# Patient Record
Sex: Female | Born: 1935 | Hispanic: No | Marital: Married
Health system: Southern US, Community
[De-identification: ages and names within clinical notes are randomized; demographics above are authoritative.]

---

## 2004-07-16 ENCOUNTER — Ambulatory Visit: Payer: Self-pay | Admitting: Internal Medicine

## 2005-04-25 ENCOUNTER — Other Ambulatory Visit: Payer: Self-pay

## 2005-04-25 ENCOUNTER — Emergency Department: Payer: Self-pay | Admitting: Emergency Medicine

## 2005-05-14 ENCOUNTER — Ambulatory Visit: Payer: Self-pay | Admitting: Gerontology

## 2005-07-29 ENCOUNTER — Ambulatory Visit: Payer: Self-pay | Admitting: Internal Medicine

## 2005-09-12 ENCOUNTER — Ambulatory Visit: Payer: Self-pay | Admitting: Internal Medicine

## 2006-11-27 ENCOUNTER — Ambulatory Visit: Payer: Self-pay | Admitting: Internal Medicine

## 2011-03-06 ENCOUNTER — Ambulatory Visit: Payer: Self-pay | Admitting: Ophthalmology

## 2011-03-06 DIAGNOSIS — I10 Essential (primary) hypertension: Secondary | ICD-10-CM

## 2011-03-18 ENCOUNTER — Ambulatory Visit: Payer: Self-pay | Admitting: Ophthalmology

## 2011-05-28 ENCOUNTER — Ambulatory Visit: Payer: Self-pay | Admitting: Internal Medicine

## 2011-06-03 ENCOUNTER — Ambulatory Visit: Payer: Self-pay | Admitting: Internal Medicine

## 2011-07-15 ENCOUNTER — Ambulatory Visit: Payer: Self-pay | Admitting: Unknown Physician Specialty

## 2011-07-17 LAB — PATHOLOGY REPORT

## 2012-05-10 ENCOUNTER — Emergency Department: Payer: Self-pay | Admitting: Emergency Medicine

## 2012-05-10 LAB — CBC
HCT: 41.2 % (ref 35.0–47.0)
HGB: 13.8 g/dL (ref 12.0–16.0)
MCHC: 33.5 g/dL (ref 32.0–36.0)
MCV: 89 fL (ref 80–100)
Platelet: 166 10*3/uL (ref 150–440)
RDW: 14 % (ref 11.5–14.5)

## 2012-05-10 LAB — COMPREHENSIVE METABOLIC PANEL
Alkaline Phosphatase: 103 U/L (ref 50–136)
Anion Gap: 6 — ABNORMAL LOW (ref 7–16)
Bilirubin,Total: 0.3 mg/dL (ref 0.2–1.0)
Calcium, Total: 7.9 mg/dL — ABNORMAL LOW (ref 8.5–10.1)
Chloride: 101 mmol/L (ref 98–107)
Co2: 29 mmol/L (ref 21–32)
Creatinine: 0.86 mg/dL (ref 0.60–1.30)
EGFR (African American): >60
EGFR (Non-African Amer.): >60
Glucose: 124 mg/dL — ABNORMAL HIGH (ref 65–99)
Potassium: 3.3 mmol/L — ABNORMAL LOW (ref 3.5–5.1)
Sodium: 136 mmol/L (ref 136–145)
Total Protein: 6.3 g/dL — ABNORMAL LOW (ref 6.4–8.2)

## 2012-05-10 LAB — TROPONIN I: Troponin-I: <0.02 ng/mL

## 2012-05-10 LAB — URINALYSIS, COMPLETE
Bacteria: NONE SEEN
Bilirubin,UR: NEGATIVE
Blood: NEGATIVE
Nitrite: NEGATIVE
Protein: NEGATIVE
Specific Gravity: 1.005 (ref 1.003–1.030)
Squamous Epithelial: NONE SEEN
WBC UR: <1 /HPF (ref 0–5)

## 2012-05-11 LAB — COMPREHENSIVE METABOLIC PANEL
Albumin: 3.2 g/dL — ABNORMAL LOW (ref 3.4–5.0)
Anion Gap: 8 (ref 7–16)
BUN: 12 mg/dL (ref 7–18)
Chloride: 96 mmol/L — ABNORMAL LOW (ref 98–107)
Co2: 25 mmol/L (ref 21–32)
Creatinine: 0.86 mg/dL (ref 0.60–1.30)
EGFR (Non-African Amer.): >60
Glucose: 98 mg/dL (ref 65–99)
Osmolality: 259 (ref 275–301)
SGPT (ALT): 40 U/L (ref 12–78)

## 2012-05-11 LAB — URINALYSIS, COMPLETE
Bacteria: NONE SEEN
Glucose,UR: NEGATIVE mg/dL (ref 0–75)
Nitrite: NEGATIVE
Ph: 5 (ref 4.5–8.0)
Protein: 30
Squamous Epithelial: 1

## 2012-05-11 LAB — CBC
HCT: 42.7 % (ref 35.0–47.0)
MCV: 89 fL (ref 80–100)
RDW: 13.7 % (ref 11.5–14.5)
WBC: 2.6 10*3/uL — ABNORMAL LOW (ref 3.6–11.0)

## 2012-05-12 ENCOUNTER — Inpatient Hospital Stay: Payer: Self-pay | Admitting: Internal Medicine

## 2012-05-13 LAB — BASIC METABOLIC PANEL
Anion Gap: 7 (ref 7–16)
BUN: 7 mg/dL (ref 7–18)
Calcium, Total: 7.7 mg/dL — ABNORMAL LOW (ref 8.5–10.1)
Chloride: 107 mmol/L (ref 98–107)
Creatinine: 0.6 mg/dL (ref 0.60–1.30)
EGFR (African American): >60
Glucose: 91 mg/dL (ref 65–99)
Osmolality: 277 (ref 275–301)
Potassium: 3.6 mmol/L (ref 3.5–5.1)

## 2012-05-13 LAB — CBC WITH DIFFERENTIAL/PLATELET
Basophil %: 0.5 %
Eosinophil %: 0.4 %
HCT: 37.1 % (ref 35.0–47.0)
HGB: 12.4 g/dL (ref 12.0–16.0)
Lymphocyte %: 26.7 %
MCH: 29.6 pg (ref 26.0–34.0)
MCV: 88 fL (ref 80–100)
Monocyte #: 0.5 x10 3/mm (ref 0.2–0.9)
Neutrophil #: 2.1 10*3/uL (ref 1.4–6.5)
Neutrophil %: 58.7 %
Platelet: 109 10*3/uL — ABNORMAL LOW (ref 150–440)
RBC: 4.2 10*6/uL (ref 3.80–5.20)
RDW: 13.8 % (ref 11.5–14.5)

## 2012-07-13 ENCOUNTER — Ambulatory Visit: Payer: Self-pay | Admitting: Unknown Physician Specialty

## 2012-10-03 LAB — COMPREHENSIVE METABOLIC PANEL
Alkaline Phosphatase: 95 U/L (ref 50–136)
BUN: 13 mg/dL (ref 7–18)
Bilirubin,Total: 0.5 mg/dL (ref 0.2–1.0)
Calcium, Total: 8.9 mg/dL (ref 8.5–10.1)
Creatinine: 0.87 mg/dL (ref 0.60–1.30)
EGFR (African American): >60
EGFR (Non-African Amer.): >60
Glucose: 110 mg/dL — ABNORMAL HIGH (ref 65–99)
Potassium: 4.1 mmol/L (ref 3.5–5.1)
SGPT (ALT): 26 U/L (ref 12–78)
Sodium: 133 mmol/L — ABNORMAL LOW (ref 136–145)

## 2012-10-03 LAB — CBC
HCT: 41.8 % (ref 35.0–47.0)
MCH: 29.4 pg (ref 26.0–34.0)
MCV: 88 fL (ref 80–100)
Platelet: 169 10*3/uL (ref 150–440)
RBC: 4.75 10*6/uL (ref 3.80–5.20)

## 2012-10-03 LAB — URINALYSIS, COMPLETE
Bilirubin,UR: NEGATIVE
Blood: NEGATIVE
Nitrite: NEGATIVE
Ph: 5 (ref 4.5–8.0)
RBC,UR: 5 /HPF (ref 0–5)
Specific Gravity: 1.021 (ref 1.003–1.030)
Squamous Epithelial: 2

## 2012-10-04 LAB — CBC WITH DIFFERENTIAL/PLATELET
Basophil %: 0.2 %
Eosinophil #: 0 10*3/uL (ref 0.0–0.7)
Eosinophil %: 0.2 %
HCT: 36.7 % (ref 35.0–47.0)
MCH: 30 pg (ref 26.0–34.0)
MCV: 88 fL (ref 80–100)
Monocyte #: 0.3 x10 3/mm (ref 0.2–0.9)
Monocyte %: 6.1 %
RBC: 4.16 10*6/uL (ref 3.80–5.20)

## 2012-10-04 LAB — BASIC METABOLIC PANEL
Anion Gap: 6 — ABNORMAL LOW (ref 7–16)
BUN: 9 mg/dL (ref 7–18)
Creatinine: 0.77 mg/dL (ref 0.60–1.30)
EGFR (African American): >60
EGFR (Non-African Amer.): >60
Glucose: 85 mg/dL (ref 65–99)
Sodium: 137 mmol/L (ref 136–145)

## 2012-10-04 LAB — TSH: Thyroid Stimulating Horm: 0.99 u[IU]/mL

## 2012-10-04 LAB — SEDIMENTATION RATE: Erythrocyte Sed Rate: 16 mm/hr (ref 0–30)

## 2012-10-05 ENCOUNTER — Inpatient Hospital Stay: Payer: Self-pay | Admitting: Internal Medicine

## 2012-10-05 LAB — BASIC METABOLIC PANEL
BUN: 8 mg/dL (ref 7–18)
Calcium, Total: 7.8 mg/dL — ABNORMAL LOW (ref 8.5–10.1)
Chloride: 107 mmol/L (ref 98–107)
Creatinine: 0.76 mg/dL (ref 0.60–1.30)
EGFR (African American): >60
EGFR (Non-African Amer.): >60
Glucose: 84 mg/dL (ref 65–99)
Osmolality: 271 (ref 275–301)
Sodium: 137 mmol/L (ref 136–145)

## 2012-10-05 LAB — CBC WITH DIFFERENTIAL/PLATELET
Basophil #: 0 10*3/uL (ref 0.0–0.1)
Eosinophil #: 0 10*3/uL (ref 0.0–0.7)
Eosinophil %: 0.3 %
Lymphocyte %: 10.8 %
MCH: 29.6 pg (ref 26.0–34.0)
Neutrophil %: 79.7 %
RBC: 4.07 10*6/uL (ref 3.80–5.20)
WBC: 4.5 10*3/uL (ref 3.6–11.0)

## 2012-10-05 LAB — URINE CULTURE

## 2012-10-06 LAB — BASIC METABOLIC PANEL
BUN: 7 mg/dL (ref 7–18)
Calcium, Total: 8.6 mg/dL (ref 8.5–10.1)
Co2: 25 mmol/L (ref 21–32)
Creatinine: 0.65 mg/dL (ref 0.60–1.30)
EGFR (African American): >60
Glucose: 76 mg/dL (ref 65–99)
Potassium: 3.8 mmol/L (ref 3.5–5.1)
Sodium: 135 mmol/L — ABNORMAL LOW (ref 136–145)

## 2013-03-11 ENCOUNTER — Inpatient Hospital Stay: Payer: Self-pay | Admitting: Internal Medicine

## 2013-03-11 LAB — COMPREHENSIVE METABOLIC PANEL
ALT: 56 U/L (ref 12–78)
AST: 100 U/L — AB (ref 15–37)
Albumin: 3.8 g/dL (ref 3.4–5.0)
Alkaline Phosphatase: 81 U/L
Anion Gap: 7 (ref 7–16)
BUN: 14 mg/dL (ref 7–18)
Bilirubin,Total: 0.3 mg/dL (ref 0.2–1.0)
CHLORIDE: 101 mmol/L (ref 98–107)
CO2: 27 mmol/L (ref 21–32)
CREATININE: 0.77 mg/dL (ref 0.60–1.30)
Calcium, Total: 8.9 mg/dL (ref 8.5–10.1)
EGFR (African American): >60
EGFR (Non-African Amer.): >60
GLUCOSE: 106 mg/dL — AB (ref 65–99)
OSMOLALITY: 271 (ref 275–301)
POTASSIUM: 3.9 mmol/L (ref 3.5–5.1)
SODIUM: 135 mmol/L — AB (ref 136–145)
TOTAL PROTEIN: 7 g/dL (ref 6.4–8.2)

## 2013-03-11 LAB — CBC
HCT: 41.7 % (ref 35.0–47.0)
HGB: 13.4 g/dL (ref 12.0–16.0)
MCH: 29.1 pg (ref 26.0–34.0)
MCHC: 32.3 g/dL (ref 32.0–36.0)
MCV: 90 fL (ref 80–100)
Platelet: 182 10*3/uL (ref 150–440)
RBC: 4.63 10*6/uL (ref 3.80–5.20)
RDW: 14 % (ref 11.5–14.5)
WBC: 11.2 10*3/uL — AB (ref 3.6–11.0)

## 2013-03-11 LAB — URINALYSIS, COMPLETE
BILIRUBIN, UR: NEGATIVE
Glucose,UR: NEGATIVE mg/dL (ref 0–75)
NITRITE: NEGATIVE
PH: 5 (ref 4.5–8.0)
Protein: 30
Specific Gravity: 1.02 (ref 1.003–1.030)
Squamous Epithelial: <1

## 2013-03-11 LAB — TROPONIN I: Troponin-I: 0.02 ng/mL

## 2013-03-13 LAB — BASIC METABOLIC PANEL
ANION GAP: 4 — AB (ref 7–16)
BUN: 24 mg/dL — ABNORMAL HIGH (ref 7–18)
Calcium, Total: 8.5 mg/dL (ref 8.5–10.1)
Chloride: 105 mmol/L (ref 98–107)
Co2: 30 mmol/L (ref 21–32)
Creatinine: 0.92 mg/dL (ref 0.60–1.30)
EGFR (African American): >60
GFR CALC NON AF AMER: 57 — AB
Glucose: 121 mg/dL — ABNORMAL HIGH (ref 65–99)
OSMOLALITY: 283 (ref 275–301)
POTASSIUM: 4.2 mmol/L (ref 3.5–5.1)
Sodium: 139 mmol/L (ref 136–145)

## 2013-03-13 LAB — CBC WITH DIFFERENTIAL/PLATELET
BASOS ABS: 0 10*3/uL (ref 0.0–0.1)
BASOS PCT: 0.1 %
EOS ABS: 0 10*3/uL (ref 0.0–0.7)
EOS PCT: 0 %
HCT: 39.9 % (ref 35.0–47.0)
HGB: 13.5 g/dL (ref 12.0–16.0)
LYMPHS PCT: 3.9 %
Lymphocyte #: 0.4 10*3/uL — ABNORMAL LOW (ref 1.0–3.6)
MCH: 30.5 pg (ref 26.0–34.0)
MCHC: 33.9 g/dL (ref 32.0–36.0)
MCV: 90 fL (ref 80–100)
MONO ABS: 0.5 x10 3/mm (ref 0.2–0.9)
Monocyte %: 4.6 %
Neutrophil #: 9.9 10*3/uL — ABNORMAL HIGH (ref 1.4–6.5)
Neutrophil %: 91.4 %
PLATELETS: 172 10*3/uL (ref 150–440)
RBC: 4.44 10*6/uL (ref 3.80–5.20)
RDW: 14.3 % (ref 11.5–14.5)
WBC: 10.8 10*3/uL (ref 3.6–11.0)

## 2013-03-15 LAB — CLOSTRIDIUM DIFFICILE(ARMC)

## 2013-03-16 LAB — CULTURE, BLOOD (SINGLE)

## 2013-03-18 DIAGNOSIS — J9610 Chronic respiratory failure, unspecified whether with hypoxia or hypercapnia: Secondary | ICD-10-CM

## 2013-03-18 DIAGNOSIS — F0280 Dementia in other diseases classified elsewhere without behavioral disturbance: Secondary | ICD-10-CM

## 2013-03-18 DIAGNOSIS — G309 Alzheimer's disease, unspecified: Secondary | ICD-10-CM

## 2013-03-18 DIAGNOSIS — A319 Mycobacterial infection, unspecified: Secondary | ICD-10-CM

## 2013-03-18 DIAGNOSIS — F3289 Other specified depressive episodes: Secondary | ICD-10-CM

## 2013-03-18 DIAGNOSIS — F329 Major depressive disorder, single episode, unspecified: Secondary | ICD-10-CM

## 2013-03-18 DIAGNOSIS — I498 Other specified cardiac arrhythmias: Secondary | ICD-10-CM

## 2013-03-26 DIAGNOSIS — R634 Abnormal weight loss: Secondary | ICD-10-CM

## 2013-04-09 DIAGNOSIS — F3289 Other specified depressive episodes: Secondary | ICD-10-CM

## 2013-04-09 DIAGNOSIS — F329 Major depressive disorder, single episode, unspecified: Secondary | ICD-10-CM

## 2013-04-14 DIAGNOSIS — J479 Bronchiectasis, uncomplicated: Secondary | ICD-10-CM

## 2013-04-14 DIAGNOSIS — J9620 Acute and chronic respiratory failure, unspecified whether with hypoxia or hypercapnia: Secondary | ICD-10-CM

## 2013-04-20 DIAGNOSIS — J9620 Acute and chronic respiratory failure, unspecified whether with hypoxia or hypercapnia: Secondary | ICD-10-CM

## 2013-04-20 DIAGNOSIS — I499 Cardiac arrhythmia, unspecified: Secondary | ICD-10-CM

## 2013-04-20 DIAGNOSIS — G309 Alzheimer's disease, unspecified: Secondary | ICD-10-CM

## 2013-04-20 DIAGNOSIS — F339 Major depressive disorder, recurrent, unspecified: Secondary | ICD-10-CM

## 2013-04-20 DIAGNOSIS — F0280 Dementia in other diseases classified elsewhere without behavioral disturbance: Secondary | ICD-10-CM

## 2013-05-12 DIAGNOSIS — R197 Diarrhea, unspecified: Secondary | ICD-10-CM

## 2013-05-12 DIAGNOSIS — I499 Cardiac arrhythmia, unspecified: Secondary | ICD-10-CM

## 2013-05-12 DIAGNOSIS — F0280 Dementia in other diseases classified elsewhere without behavioral disturbance: Secondary | ICD-10-CM

## 2013-05-12 DIAGNOSIS — J9620 Acute and chronic respiratory failure, unspecified whether with hypoxia or hypercapnia: Secondary | ICD-10-CM

## 2013-05-12 DIAGNOSIS — F3289 Other specified depressive episodes: Secondary | ICD-10-CM

## 2013-05-12 DIAGNOSIS — F329 Major depressive disorder, single episode, unspecified: Secondary | ICD-10-CM

## 2013-05-12 DIAGNOSIS — G309 Alzheimer's disease, unspecified: Secondary | ICD-10-CM

## 2013-06-07 ENCOUNTER — Inpatient Hospital Stay: Payer: Self-pay | Admitting: Student

## 2013-06-07 LAB — URINALYSIS, COMPLETE
Bacteria: NONE SEEN
Bilirubin,UR: NEGATIVE
Blood: NEGATIVE
GLUCOSE, UR: NEGATIVE mg/dL (ref 0–75)
Ketone: NEGATIVE
Leukocyte Esterase: NEGATIVE
Nitrite: NEGATIVE
Ph: 5 (ref 4.5–8.0)
Protein: NEGATIVE
RBC,UR: 1 /HPF (ref 0–5)
SPECIFIC GRAVITY: 1.021 (ref 1.003–1.030)
SQUAMOUS EPITHELIAL: NONE SEEN
WBC UR: <1 /HPF (ref 0–5)

## 2013-06-07 LAB — COMPREHENSIVE METABOLIC PANEL
ALK PHOS: 98 U/L
AST: 19 U/L (ref 15–37)
Albumin: 2.8 g/dL — ABNORMAL LOW (ref 3.4–5.0)
Anion Gap: 8 (ref 7–16)
BILIRUBIN TOTAL: 0.3 mg/dL (ref 0.2–1.0)
BUN: 14 mg/dL (ref 7–18)
Calcium, Total: 8.8 mg/dL (ref 8.5–10.1)
Chloride: 104 mmol/L (ref 98–107)
Co2: 26 mmol/L (ref 21–32)
Creatinine: 0.63 mg/dL (ref 0.60–1.30)
EGFR (African American): >60
Glucose: 99 mg/dL (ref 65–99)
OSMOLALITY: 276 (ref 275–301)
POTASSIUM: 3.6 mmol/L (ref 3.5–5.1)
SGPT (ALT): 19 U/L (ref 12–78)
Sodium: 138 mmol/L (ref 136–145)
TOTAL PROTEIN: 6.2 g/dL — AB (ref 6.4–8.2)

## 2013-06-07 LAB — CBC
HCT: 37.1 % (ref 35.0–47.0)
HGB: 11.9 g/dL — ABNORMAL LOW (ref 12.0–16.0)
MCH: 28.2 pg (ref 26.0–34.0)
MCHC: 32.1 g/dL (ref 32.0–36.0)
MCV: 88 fL (ref 80–100)
Platelet: 262 10*3/uL (ref 150–440)
RBC: 4.22 10*6/uL (ref 3.80–5.20)
RDW: 15.3 % — AB (ref 11.5–14.5)
WBC: 8.3 10*3/uL (ref 3.6–11.0)

## 2013-06-07 LAB — PROTIME-INR
INR: 1.1
PROTHROMBIN TIME: 13.7 s (ref 11.5–14.7)

## 2013-06-07 LAB — APTT: Activated PTT: 29.7 secs (ref 23.6–35.9)

## 2013-06-08 LAB — BASIC METABOLIC PANEL
Anion Gap: 5 — ABNORMAL LOW (ref 7–16)
BUN: 11 mg/dL (ref 7–18)
CALCIUM: 7.8 mg/dL — AB (ref 8.5–10.1)
CO2: 28 mmol/L (ref 21–32)
CREATININE: 0.71 mg/dL (ref 0.60–1.30)
Chloride: 107 mmol/L (ref 98–107)
EGFR (African American): >60
Glucose: 100 mg/dL — ABNORMAL HIGH (ref 65–99)
OSMOLALITY: 279 (ref 275–301)
Potassium: 3.4 mmol/L — ABNORMAL LOW (ref 3.5–5.1)
SODIUM: 140 mmol/L (ref 136–145)

## 2013-06-08 LAB — CBC WITH DIFFERENTIAL/PLATELET
BASOS PCT: 0.2 %
Basophil #: 0 10*3/uL (ref 0.0–0.1)
Eosinophil #: 0.1 10*3/uL (ref 0.0–0.7)
Eosinophil %: 1.7 %
HCT: 29 % — AB (ref 35.0–47.0)
HGB: 9.5 g/dL — AB (ref 12.0–16.0)
Lymphocyte #: 0.7 10*3/uL — ABNORMAL LOW (ref 1.0–3.6)
Lymphocyte %: 8 %
MCH: 28.8 pg (ref 26.0–34.0)
MCHC: 32.6 g/dL (ref 32.0–36.0)
MCV: 88 fL (ref 80–100)
MONOS PCT: 11.6 %
Monocyte #: 1 x10 3/mm — ABNORMAL HIGH (ref 0.2–0.9)
Neutrophil #: 6.9 10*3/uL — ABNORMAL HIGH (ref 1.4–6.5)
Neutrophil %: 78.5 %
Platelet: 203 10*3/uL (ref 150–440)
RBC: 3.29 10*6/uL — ABNORMAL LOW (ref 3.80–5.20)
RDW: 15.1 % — ABNORMAL HIGH (ref 11.5–14.5)
WBC: 8.7 10*3/uL (ref 3.6–11.0)

## 2013-06-09 LAB — BASIC METABOLIC PANEL
ANION GAP: 5 — AB (ref 7–16)
BUN: 6 mg/dL — ABNORMAL LOW (ref 7–18)
CALCIUM: 7.9 mg/dL — AB (ref 8.5–10.1)
Chloride: 104 mmol/L (ref 98–107)
Co2: 27 mmol/L (ref 21–32)
Creatinine: 0.71 mg/dL (ref 0.60–1.30)
EGFR (African American): >60
Glucose: 108 mg/dL — ABNORMAL HIGH (ref 65–99)
Osmolality: 270 (ref 275–301)
POTASSIUM: 3.2 mmol/L — AB (ref 3.5–5.1)
Sodium: 136 mmol/L (ref 136–145)

## 2013-06-09 LAB — CBC WITH DIFFERENTIAL/PLATELET
Basophil #: 0 10*3/uL (ref 0.0–0.1)
Basophil %: 0.2 %
EOS ABS: 0.2 10*3/uL (ref 0.0–0.7)
EOS PCT: 1.3 %
HCT: 27.9 % — AB (ref 35.0–47.0)
HGB: 9 g/dL — AB (ref 12.0–16.0)
Lymphocyte #: 0.9 10*3/uL — ABNORMAL LOW (ref 1.0–3.6)
Lymphocyte %: 6 %
MCH: 28.3 pg (ref 26.0–34.0)
MCHC: 32.3 g/dL (ref 32.0–36.0)
MCV: 88 fL (ref 80–100)
MONOS PCT: 10.2 %
Monocyte #: 1.5 x10 3/mm — ABNORMAL HIGH (ref 0.2–0.9)
NEUTROS ABS: 12.1 10*3/uL — AB (ref 1.4–6.5)
NEUTROS PCT: 82.3 %
Platelet: 196 10*3/uL (ref 150–440)
RBC: 3.17 10*6/uL — ABNORMAL LOW (ref 3.80–5.20)
RDW: 15.4 % — ABNORMAL HIGH (ref 11.5–14.5)
WBC: 14.6 10*3/uL — ABNORMAL HIGH (ref 3.6–11.0)

## 2013-06-10 LAB — CBC WITH DIFFERENTIAL/PLATELET
Basophil #: 0 10*3/uL (ref 0.0–0.1)
Basophil %: 0.3 %
EOS ABS: 0.2 10*3/uL (ref 0.0–0.7)
EOS PCT: 1.2 %
HCT: 27.3 % — ABNORMAL LOW (ref 35.0–47.0)
HGB: 8.6 g/dL — ABNORMAL LOW (ref 12.0–16.0)
LYMPHS ABS: 0.7 10*3/uL — AB (ref 1.0–3.6)
LYMPHS PCT: 5.5 %
MCH: 27.5 pg (ref 26.0–34.0)
MCHC: 31.3 g/dL — AB (ref 32.0–36.0)
MCV: 88 fL (ref 80–100)
MONO ABS: 1.1 x10 3/mm — AB (ref 0.2–0.9)
Monocyte %: 8.2 %
NEUTROS PCT: 84.8 %
Neutrophil #: 11 10*3/uL — ABNORMAL HIGH (ref 1.4–6.5)
PLATELETS: 199 10*3/uL (ref 150–440)
RBC: 3.11 10*6/uL — ABNORMAL LOW (ref 3.80–5.20)
RDW: 15.2 % — ABNORMAL HIGH (ref 11.5–14.5)
WBC: 13 10*3/uL — AB (ref 3.6–11.0)

## 2013-06-10 LAB — BASIC METABOLIC PANEL
Anion Gap: 4 — ABNORMAL LOW (ref 7–16)
BUN: 4 mg/dL — ABNORMAL LOW (ref 7–18)
CALCIUM: 8 mg/dL — AB (ref 8.5–10.1)
CHLORIDE: 100 mmol/L (ref 98–107)
Co2: 30 mmol/L (ref 21–32)
Creatinine: 0.46 mg/dL — ABNORMAL LOW (ref 0.60–1.30)
EGFR (African American): >60
EGFR (Non-African Amer.): >60
Glucose: 113 mg/dL — ABNORMAL HIGH (ref 65–99)
Osmolality: 266 (ref 275–301)
Potassium: 3.3 mmol/L — ABNORMAL LOW (ref 3.5–5.1)
SODIUM: 134 mmol/L — AB (ref 136–145)

## 2013-06-12 LAB — CBC WITH DIFFERENTIAL/PLATELET
Basophil #: 0 10*3/uL (ref 0.0–0.1)
Basophil %: 0.5 %
EOS ABS: 0.5 10*3/uL (ref 0.0–0.7)
EOS PCT: 5.8 %
HCT: 24.2 % — AB (ref 35.0–47.0)
HGB: 7.8 g/dL — ABNORMAL LOW (ref 12.0–16.0)
Lymphocyte #: 0.4 10*3/uL — ABNORMAL LOW (ref 1.0–3.6)
Lymphocyte %: 4.5 %
MCH: 28.3 pg (ref 26.0–34.0)
MCHC: 32.2 g/dL (ref 32.0–36.0)
MCV: 88 fL (ref 80–100)
MONO ABS: 0.7 x10 3/mm (ref 0.2–0.9)
MONOS PCT: 8 %
NEUTROS ABS: 6.8 10*3/uL — AB (ref 1.4–6.5)
Neutrophil %: 81.2 %
Platelet: 218 10*3/uL (ref 150–440)
RBC: 2.75 10*6/uL — ABNORMAL LOW (ref 3.80–5.20)
RDW: 15.3 % — AB (ref 11.5–14.5)
WBC: 8.3 10*3/uL (ref 3.6–11.0)

## 2013-06-12 LAB — BASIC METABOLIC PANEL
Anion Gap: 6 — ABNORMAL LOW (ref 7–16)
BUN: 6 mg/dL — ABNORMAL LOW (ref 7–18)
CREATININE: 0.66 mg/dL (ref 0.60–1.30)
Calcium, Total: 7.7 mg/dL — ABNORMAL LOW (ref 8.5–10.1)
Chloride: 106 mmol/L (ref 98–107)
Co2: 32 mmol/L (ref 21–32)
EGFR (African American): >60
Glucose: 133 mg/dL — ABNORMAL HIGH (ref 65–99)
Osmolality: 286 (ref 275–301)
Potassium: 3 mmol/L — ABNORMAL LOW (ref 3.5–5.1)
Sodium: 144 mmol/L (ref 136–145)

## 2013-06-13 LAB — CBC WITH DIFFERENTIAL/PLATELET
Basophil #: 0 10*3/uL (ref 0.0–0.1)
Basophil %: 0.3 %
EOS ABS: 0.8 10*3/uL — AB (ref 0.0–0.7)
EOS PCT: 11.5 %
HCT: 25.3 % — ABNORMAL LOW (ref 35.0–47.0)
HGB: 8.1 g/dL — AB (ref 12.0–16.0)
LYMPHS PCT: 8.2 %
Lymphocyte #: 0.6 10*3/uL — ABNORMAL LOW (ref 1.0–3.6)
MCH: 27.9 pg (ref 26.0–34.0)
MCHC: 32.1 g/dL (ref 32.0–36.0)
MCV: 87 fL (ref 80–100)
Monocyte #: 0.8 x10 3/mm (ref 0.2–0.9)
Monocyte %: 10.4 %
NEUTROS ABS: 5 10*3/uL (ref 1.4–6.5)
NEUTROS PCT: 69.6 %
PLATELETS: 228 10*3/uL (ref 150–440)
RBC: 2.9 10*6/uL — ABNORMAL LOW (ref 3.80–5.20)
RDW: 14.9 % — ABNORMAL HIGH (ref 11.5–14.5)
WBC: 7.2 10*3/uL (ref 3.6–11.0)

## 2013-06-14 LAB — CBC WITH DIFFERENTIAL/PLATELET
Basophil #: 0.1 10*3/uL (ref 0.0–0.1)
Basophil %: 0.8 %
Eosinophil #: 0.8 10*3/uL — ABNORMAL HIGH (ref 0.0–0.7)
Eosinophil %: 10 %
HCT: 27.9 % — ABNORMAL LOW (ref 35.0–47.0)
HGB: 9.1 g/dL — ABNORMAL LOW (ref 12.0–16.0)
LYMPHS PCT: 7.2 %
Lymphocyte #: 0.5 10*3/uL — ABNORMAL LOW (ref 1.0–3.6)
MCH: 28.4 pg (ref 26.0–34.0)
MCHC: 32.6 g/dL (ref 32.0–36.0)
MCV: 87 fL (ref 80–100)
Monocyte #: 0.7 x10 3/mm (ref 0.2–0.9)
Monocyte %: 9.4 %
NEUTROS PCT: 72.6 %
Neutrophil #: 5.5 10*3/uL (ref 1.4–6.5)
PLATELETS: 276 10*3/uL (ref 150–440)
RBC: 3.2 10*6/uL — ABNORMAL LOW (ref 3.80–5.20)
RDW: 15 % — ABNORMAL HIGH (ref 11.5–14.5)
WBC: 7.5 10*3/uL (ref 3.6–11.0)

## 2013-06-15 LAB — CBC WITH DIFFERENTIAL/PLATELET
BASOS ABS: 0 10*3/uL (ref 0.0–0.1)
BASOS PCT: 0.3 %
EOS PCT: 2.2 %
Eosinophil #: 0.2 10*3/uL (ref 0.0–0.7)
HCT: 27.4 % — ABNORMAL LOW (ref 35.0–47.0)
HGB: 9 g/dL — AB (ref 12.0–16.0)
Lymphocyte #: 0.6 10*3/uL — ABNORMAL LOW (ref 1.0–3.6)
Lymphocyte %: 5.6 %
MCH: 28.9 pg (ref 26.0–34.0)
MCHC: 33.1 g/dL (ref 32.0–36.0)
MCV: 88 fL (ref 80–100)
MONO ABS: 0.8 x10 3/mm (ref 0.2–0.9)
MONOS PCT: 7.5 %
Neutrophil #: 9.2 10*3/uL — ABNORMAL HIGH (ref 1.4–6.5)
Neutrophil %: 84.4 %
PLATELETS: 299 10*3/uL (ref 150–440)
RBC: 3.13 10*6/uL — ABNORMAL LOW (ref 3.80–5.20)
RDW: 15.2 % — AB (ref 11.5–14.5)
WBC: 10.9 10*3/uL (ref 3.6–11.0)

## 2013-06-15 LAB — BASIC METABOLIC PANEL
Anion Gap: 7 (ref 7–16)
BUN: 5 mg/dL — ABNORMAL LOW (ref 7–18)
Calcium, Total: 8.1 mg/dL — ABNORMAL LOW (ref 8.5–10.1)
Chloride: 104 mmol/L (ref 98–107)
Co2: 28 mmol/L (ref 21–32)
Creatinine: 0.53 mg/dL — ABNORMAL LOW (ref 0.60–1.30)
EGFR (African American): >60
EGFR (Non-African Amer.): >60
Glucose: 86 mg/dL (ref 65–99)
OSMOLALITY: 274 (ref 275–301)
Potassium: 3.3 mmol/L — ABNORMAL LOW (ref 3.5–5.1)
Sodium: 139 mmol/L (ref 136–145)

## 2013-06-15 LAB — CLOSTRIDIUM DIFFICILE(ARMC)

## 2013-06-16 DIAGNOSIS — J690 Pneumonitis due to inhalation of food and vomit: Secondary | ICD-10-CM

## 2013-06-16 DIAGNOSIS — S7290XA Unspecified fracture of unspecified femur, initial encounter for closed fracture: Secondary | ICD-10-CM

## 2013-06-18 ENCOUNTER — Telehealth: Payer: Self-pay | Admitting: *Deleted

## 2013-06-18 NOTE — Telephone Encounter (Signed)
Pt requesting mediation refill of hydrocodone/ apap 5/325. Medication is not on pts medication list- past or present. pls advise

## 2013-06-19 NOTE — Telephone Encounter (Signed)
She is an inpatient in [facility] re[facility] No requests should be coming to our office--they are done there

## 2013-06-21 NOTE — Telephone Encounter (Signed)
Request denied.

## 2013-07-14 ENCOUNTER — Emergency Department: Payer: Self-pay | Admitting: Emergency Medicine

## 2013-08-17 DIAGNOSIS — I499 Cardiac arrhythmia, unspecified: Secondary | ICD-10-CM

## 2013-08-17 DIAGNOSIS — F411 Generalized anxiety disorder: Secondary | ICD-10-CM

## 2013-08-17 DIAGNOSIS — G309 Alzheimer's disease, unspecified: Secondary | ICD-10-CM

## 2013-08-17 DIAGNOSIS — F3289 Other specified depressive episodes: Secondary | ICD-10-CM

## 2013-08-17 DIAGNOSIS — F329 Major depressive disorder, single episode, unspecified: Secondary | ICD-10-CM

## 2013-08-17 DIAGNOSIS — F0280 Dementia in other diseases classified elsewhere without behavioral disturbance: Secondary | ICD-10-CM

## 2013-08-17 DIAGNOSIS — J9610 Chronic respiratory failure, unspecified whether with hypoxia or hypercapnia: Secondary | ICD-10-CM

## 2013-10-13 DIAGNOSIS — F0280 Dementia in other diseases classified elsewhere without behavioral disturbance: Secondary | ICD-10-CM

## 2013-10-13 DIAGNOSIS — S7290XA Unspecified fracture of unspecified femur, initial encounter for closed fracture: Secondary | ICD-10-CM

## 2013-10-13 DIAGNOSIS — R21 Rash and other nonspecific skin eruption: Secondary | ICD-10-CM

## 2013-10-13 DIAGNOSIS — I471 Supraventricular tachycardia: Secondary | ICD-10-CM

## 2013-10-13 DIAGNOSIS — J9600 Acute respiratory failure, unspecified whether with hypoxia or hypercapnia: Secondary | ICD-10-CM

## 2013-10-13 DIAGNOSIS — F3289 Other specified depressive episodes: Secondary | ICD-10-CM

## 2013-10-13 DIAGNOSIS — N052 Unspecified nephritic syndrome with diffuse membranous glomerulonephritis: Secondary | ICD-10-CM

## 2013-10-13 DIAGNOSIS — F329 Major depressive disorder, single episode, unspecified: Secondary | ICD-10-CM

## 2013-10-13 DIAGNOSIS — G309 Alzheimer's disease, unspecified: Secondary | ICD-10-CM

## 2013-10-13 DIAGNOSIS — F411 Generalized anxiety disorder: Secondary | ICD-10-CM

## 2013-12-17 DIAGNOSIS — G301 Alzheimer's disease with late onset: Secondary | ICD-10-CM

## 2013-12-17 DIAGNOSIS — J9610 Chronic respiratory failure, unspecified whether with hypoxia or hypercapnia: Secondary | ICD-10-CM

## 2013-12-17 DIAGNOSIS — I499 Cardiac arrhythmia, unspecified: Secondary | ICD-10-CM

## 2013-12-17 DIAGNOSIS — F39 Unspecified mood [affective] disorder: Secondary | ICD-10-CM

## 2013-12-17 DIAGNOSIS — K219 Gastro-esophageal reflux disease without esophagitis: Secondary | ICD-10-CM

## 2014-02-18 DIAGNOSIS — G309 Alzheimer's disease, unspecified: Secondary | ICD-10-CM

## 2014-02-18 DIAGNOSIS — I499 Cardiac arrhythmia, unspecified: Secondary | ICD-10-CM

## 2014-02-18 DIAGNOSIS — J9610 Chronic respiratory failure, unspecified whether with hypoxia or hypercapnia: Secondary | ICD-10-CM

## 2014-02-18 DIAGNOSIS — G479 Sleep disorder, unspecified: Secondary | ICD-10-CM

## 2014-02-18 DIAGNOSIS — K219 Gastro-esophageal reflux disease without esophagitis: Secondary | ICD-10-CM

## 2014-02-18 DIAGNOSIS — F39 Unspecified mood [affective] disorder: Secondary | ICD-10-CM

## 2014-04-26 DIAGNOSIS — F39 Unspecified mood [affective] disorder: Secondary | ICD-10-CM | POA: Diagnosis not present

## 2014-04-26 DIAGNOSIS — I499 Cardiac arrhythmia, unspecified: Secondary | ICD-10-CM

## 2014-04-26 DIAGNOSIS — J9620 Acute and chronic respiratory failure, unspecified whether with hypoxia or hypercapnia: Secondary | ICD-10-CM | POA: Diagnosis not present

## 2014-04-26 DIAGNOSIS — G301 Alzheimer's disease with late onset: Secondary | ICD-10-CM

## 2014-05-12 DIAGNOSIS — J181 Lobar pneumonia, unspecified organism: Secondary | ICD-10-CM | POA: Diagnosis not present

## 2014-05-26 DIAGNOSIS — J9611 Chronic respiratory failure with hypoxia: Secondary | ICD-10-CM | POA: Diagnosis not present

## 2014-05-27 NOTE — Discharge Summary (Signed)
PATIENT NAME:  Mindy Gilbert, Mindy Gilbert MR#:  [id number] DATE OF BIRTH:  Sep 19, 1925  DATE OF ADMISSION:  10/05/2012,  though was placed on observation 10/03/2012.   DATE OF PLANNED DISCHARGE:  10/06/2012  DISCHARGE DIAGNOSES: 1.  Encephalopathy.  2.  Dementia, seemingly back to baseline presently.  3.  Pyuria, with negative urine culture.  4.  Bronchiectasis without clear flare, though has had a few days of ceftriaxone.  5.  Nausea, which has cleared. She had a good breakfast this morning to my observation.  6.  Tachycardia, now resolved.   DISCHARGE MEDICATIONS:  1.  Usual inhalers and nebulizers that she has at home. As well as,  2.  Namenda 10 mg b.i.d.  3.  Plavix 75 daily.  4.  Protonix 40 mg daily.  5.  Digoxin 125 mcg daily, though digoxin level is pending presently.   HISTORY AND PHYSICAL: Please see detailed history and physical done on admission.   HOSPITAL COURSE: The patient was admitted confused, possible UTI. The urine culture was negative. She had reported nausea on admission, which has cleared up. She is eating well now, though generally not had a good appetite with and much of her bronchiectasis, and has been gradually losing weight over the years.   Further workup was generally unrevealing with negative lipase, troponin. Chest x-ray showed underlying bronchiectasis, likely nothing new. Again, she was given 4 days of IV antibiotics which should suffice for the bronchiectasis flare.   CBC was normal today, as was a MET-B. Her white count was normal on admission as well, although she had a slightly low sodium level on admission. She will be discharged to home health, follow her closely, and will see her in the office soon as well if her husband can bring her. This is the first day I saw her in the hospital. Took approximately 35 minutes to do all discharge tasks.   ____________________________ Ocie Cornfield. Ouida Sills, MD mwa:dm D: 10/06/2012 08:46:00 ET T: 10/06/2012 09:27:32  ET JOB#: [id number]  cc: Ocie Cornfield. Ouida Sills, MD, <Dictator> Kirk Ruths MD ELECTRONICALLY SIGNED 10/08/2012 8:55

## 2014-05-27 NOTE — H&P (Signed)
PATIENT NAME:  Mindy Gilbert, Mindy Gilbert MR#:  [id number] DATE OF BIRTH:  1925/09/14  DATE OF ADMISSION:  05/12/2012  PRIMARY CARE PHYSICIAN: Dr. Dareen PianoAnderson.   REFERRING PHYSICIAN: Dr. Lucrezia EuropeAllison Webster.   CHIEF COMPLAINT: Cough, lethargy.   HISTORY OF PRESENT ILLNESS: The patient is an 81 year old, pleasant, white female with a history of dementia, previous history of Mycobacterium avium intracellulare has been having cough and generalized weakness for the last 1 week. Concerning this, the patient was brought to the Emergency Department yesterday. Chest x-ray revealed left lower lobe pneumonia. The patient was discharged home with Levaquin. However, the patient has significantly decreased p.o. intake. Tonight, continued to have nausea, mild abdominal pain, unable to stand up. The patient's husband, who also has been sick, was unable to provide any care for this patient. Concerning this, the family brought her to the Emergency Department. The patient continued to have cough and nausea in the Emergency Department. The patient was given 1 dose of Zofran with significant improvement of nausea. Per family, did not have any fever. However, has lethargy. At baseline, the patient is ambulatory and was independent of ADLs. Currently, the patient has been in bed for the last 1 week. The patient also had a fall on Saturday, and the patient's husband could not help her stand up. Secondary to the patient's dementia, was unable to provide all of the history. The history is mainly obtained from the patient's daughter who is at the bedside. On workup in the Emergency Department, the patient was found to have WBC count of 2.6, sodium of 129. Denied having any chest pain or abdominal pain at this time.   PAST MEDICAL HISTORY:  1. Dementia.  2. 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  3. Irritable bowel syndrome.   PAST SURGICAL HISTORY: None.   ALLERGIES: No known drug allergies.   HOME  MEDICATIONS:  1. Tylenol 500 mg 1 to 2 tablets every 6 hours as needed.  2. Plavix 75 mg once a day.  3. Namenda 10 mg 1 tablet 2 times a day.  4. Levofloxacin 750 mg 1 tablet every 24 hours.  5. Dicyclomine 20 mg 1 tablet 3 times a day.   SOCIAL HISTORY: No history of smoking, drinking alcohol or using illicit drugs. Married, lives with her husband who is 79 years old with multiple medical problems. Prior to this illness, the patient was independent of ADLs. The patient has 2 daughters who live out of town.   FAMILY HISTORY: TB in her father. Sister with breast cancer at the age of 79.   REVIEW OF SYSTEMS:  GENERAL: Denied having any weight loss; however, has been fatigued, has weakness.  EYES: No blurred vision. No discharge from the eyes.  ENT: No pain in the ears.  RESPIRATORY: Has cough.  CARDIOVASCULAR: No chest pain. No pedal edema.  GASTROINTESTINAL: Has nausea, mild abdominal pain with cough. Has been having diarrhea.  GENITOURINARY: Dysuria and hematuria.  ENDOCRINE: No polyuria or polydipsia.   HEMATOLOGIC: No easy bruising or bleeding.  SKIN: No rashes or lesions.  NEUROLOGIC: No numbness or weakness.  PSYCHIATRIC: Has dementia.   PHYSICAL EXAMINATION:  GENERAL: This is a frail-looking female, looks appropriate for stated age, lying down in the bed, not in distress.  VITAL SIGNS: Temperature 98.8, pulse 73, blood pressure 107/66, respiratory rate of 18, oxygen saturation 94% on room air.  HEENT: Head normocephalic, atraumatic. Eyes: No scleral icterus. Conjunctivae normal. Pupils equal and react to light. Extraocular  movements are intact. Mucous membranes mild dryness. No pharyngeal erythema.  NECK: Supple. No lymphadenopathy. No JVD. No carotid bruit. No thyromegaly.  CHEST: Has no focal tenderness. Has somewhat decreased breath sounds and crackles in the left lower lobe and also has bronchial breath sounds in that area. Right lung is clear to auscultation.   CARDIOVASCULAR: S1 and S2 are somewhat irregular. No murmurs are heard. Pedal pulses 2+.  ABDOMEN: Bowel sounds present. Mild tenderness, seems to be more of a muscle wall pain. No guarding or rebound tenderness. No hepatosplenomegaly.  EXTREMITIES: No pedal edema.  MUSCULOSKELETAL: Has good range of motion in all of the extremities.  SKIN: No rash or lesions.  LYMPHATIC SYSTEM: No cervical, axillary or inguinal lymphadenopathy.  NEUROLOGIC: The patient is alert, oriented to self and person but not to time. Cranial nerves II through XII intact. Motor 5/5 in upper and lower extremities.   LABS: CBC: WBC of 2.6, hemoglobin 11.6, platelet count of 160. CMP: Sodium 129. The rest of the values are within normal limits. Troponin is 0.02. Cardiac enzymes: CK 580, CK-MB 3.3. UA negative for nitrites and leukocyte esterase. Chest x-ray shows left lower lobe infiltrate suggestive of pneumonia.   ASSESSMENT AND PLAN: The patient is an 81 year old female who comes to the Emergency Department with a cough. lethargy and decreased p.o. intake.  1. Pneumonia: Will treat it as a community-acquired pneumonia with Rocephin and Zithromax. The patient has a low white blood cell count which is decreased from yesterday. Currently, afebrile. However, considering the patient's decreased p.o. intake, lethargy, unable to walk, will admit the patient to a medical bed. Continue with antibiotics and continue with intravenous fluids.  2. Hyponatremia: This is combination of pneumonia and dehydration. Continue with intravenous fluids and follow up with a BMP.  3. Debility: Will involve the physical therapy and occupational therapy. The patient's husband, who has multiple medical problems, is unable to provide any care for this patient at this time. The patient would benefit going to skilled nursing facility.  4. Dementia: Continue with Namenda.  5. Keep the patient on deep vein thrombosis prophylaxis with Lovenox.   TIME SPENT:  50 minutes.    ____________________________ Susa Griffins, MD pv:gb D: 05/12/2012 01:18:37 ET T: 05/12/2012 02:58:16 ET JOB#: [id number]  cc: Susa Griffins, MD, <Dictator> Dr. Larina Earthly Laconya Clere MD ELECTRONICALLY SIGNED 05/12/2012 6:40

## 2014-05-27 NOTE — H&P (Signed)
PATIENT NAME:  Mindy Gilbert, Mindy Gilbert MR#:  [id number] DATE OF BIRTH:  [birth year]  DATE OF ADMISSION:  10/04/2012  REFERRING PHYSICIAN:  Janalyn Harder, M.D.   PRIMARY CARE PHYSICIAN:  Einar Crow, M.D.    CHIEF COMPLAINT: Nausea, weakness.   HISTORY OF PRESENT ILLNESS: This is an 81 year old pleasant female with history of dementia, irritable bowel syndrome, history of Mycobacterium avium intracellulare in the past which was treated, as well as recent diagnosis of H. pylori status post endoscopy, finished her treatment with p.o. antibiotic, presents today with complaints of weakness, nausea and decreased p.o. intake. The patient has advanced dementia. Most of the history was obtained from the husband who is at the bedside. He reports his wife has been, for the last 4 to 5 days, feeling nauseous, as well as had significantly decreased her p.o. intake. As well, she was nauseous, as well weak, but he denies any fever or chills as well. The patient did not have any fever in ED. The patient denies any chest pain, any shortness of breath, any productive sputum, any dysuria or polyuria. The patient had basic work-up done in ED. Her labs were essentially normal, but urinalysis was significant for a UTI, but the patient was tachycardic with heart rate remaining in the 120s; EKG showing sinus tachycardia. The patient received aggressive hydration in ED including 1.5 liters of normal saline, and, despite that, she remained tachycardic in the 120s, so hospitalist service were requested to admit the patient with appropriate hydration and treatment for her urinary tract infection.   PAST MEDICAL HISTORY: 1.  Dementia.  2.  Irritable bowel syndrome.  3.  Mycobacterium avium intracellulare, diagnosed in years back and was treated.  4.  Recent diagnosis of H. pylori, treated with p.o. antibiotics as treatment a few weeks ago.   PAST SURGICAL HISTORY:  None.   ALLERGIES: No known drug allergies.   HOME  MEDICATIONS: 1.  Tylenol as needed.  2.  Plavix 75 mg oral daily.  3.  Namenda 10 mg p.o. b.i.d.  4.  Dicyclomine 20 mg oral 3 times a day as needed.   SOCIAL HISTORY: No history of smoking, alcohol or illicit drug use. Married, lives with her husband.   FAMILY HISTORY: TB in her father, sister with breast cancer at age of 33.   REVIEW OF SYSTEMS: The patient has dementia and somehow confused and her review of systems is not very reliable, but husband gave most of her review of systems.   GENERAL: No fever, no chills, there is weakness. No weight gain. No weight loss, but there is loss of appetite.  EYES: There is no blurry vision, double vision,  inflammation or glaucoma.  EARS, NOSE, THROAT: No tinnitus, ear pain, epistaxis or discharge.  RESPIRATORY: Has baseline shortness of breath secondary to her Mycobacterium, has cough at baseline, not changed. No wheezing. No painful respiratory.  CARDIOVASCULAR: No chest pain, edema, palpitations, syncope.  GASTROINTESTINAL: Complains of nausea, but no vomiting, diarrhea, abdominal pain, hematemesis, jaundice or melena.  GENITOURINARY: No dysuria, hematuria, renal colic.  ENDOCRINE: Denies polyuria, polydipsia, heat or cold intolerance.  HEMATOLOGY: Denies anemia, easy bruising or bleeding.  SKIN: No acne, rash or skin lesions. MUSCULOSKELETAL: Generally weak, no swelling, gout, cramps.  NEUROLOGICAL: No history of CVA, TIA, seizures, vertigo, tremors. Has dementia.  PSYCHIATRIC: Denies anxiety, insomnia, depression or schizophrenia.   PHYSICAL EXAMINATION: VITAL SIGNS: Temperature 99, pulse 126, respiratory rate 18, blood pressure 105/63, saturating 93% on room air.  GENERAL: Pleasant,  elderly female who looks comfortable, in no apparent distress.  HEENT: Head is atraumatic, normocephalic. Pupils equal, reactive to light. Pink conjunctivae. Anicteric sclerae. Dry oral mucosa. Wearing glasses.  NECK: Supple. No thyromegaly. No JVD. No carotid  bruits.   CHEST: Had good air entry bilaterally with scattered rales and rhonchi. No wheezing. No crackles.  CARDIOVASCULAR: S1, S2 heard. No rubs, murmurs, gallops. Tachycardic, but regular.  ABDOMEN: Soft, nontender, nondistended. Bowel sounds present.  EXTREMITIES: No edema. No clubbing. No cyanosis. Dorsalis pedis pulse +2 bilaterally. Radial pulse +2 bilaterally.  MUSCULOSKELETAL: No joint effusion or erythema.  LYMPHATIC: No cervical or supraclavicular lymphadenopathy.  PSYCHIATRIC: The patient is awake, communicative, alert x 2.  NEUROLOGICAL: Grossly intact, motor 5 out of 5, no focal deficits.    PERTINENT LABORATORY DATA: Glucose 110, BUN 13, creatinine 0.87, sodium 133, potassium 4.1, chloride 100, CO2 26, ALT 26, AST 30, alkaline phosphatase 95. Troponin less than 0.02. White blood cells 7.1, hemoglobin 14, hematocrit 41, platelets 169. Urinalysis shows +3 leukocyte esterase and 10 white blood cells, trace bacteria in the urine. Lactic acid 0.5. EKG showing sinus tachycardia at 121 beats per minute with a right bundle branch block;  which is unchanged from her last EKG in April of this year.   ASSESSMENT AND PLAN: 1.  Urinary tract infection. The patient will be started on Rocephin. We will follow and get a urine culture and use antibiotics if needed.  2.  Sinus tachycardia. This is due to dehydration and volume depletion and decreased p.o. intake. We will continue with IV fluids. We will have her _________ next week.   3.   Weakness. Will consult physical therapy.  4.   Dementia. Continue with Namenda.  5.   Deep vein thrombosis prophylaxis. Subcutaneous heparin.  6.  CODE STATUS. Discussed with the husband reports she has a LIVING WILL. He and  her two daughters have healthcare power of attorney and she is a full code.   Total Time Spent on admission and patient care: 45 minutes.    ____________________________ Starleen Armsawood S. Kohl Polinsky, MD dse:nts D: 10/03/2012 23:49:13  ET T: 10/04/2012 02:09:37 ET JOB#: [id number]  cc: Starleen Armsawood S. Shani Fitch, MD, <Dictator> Dietrich Ke Teena IraniS Maebel Marasco MD ELECTRONICALLY SIGNED 10/06/2012 2:55

## 2014-05-27 NOTE — Discharge Summary (Signed)
Dates of Admission and Diagnosis:  Date of Admission 12-May-2012   Date of Discharge 01-Jan-0001   Admitting Diagnosis pneumonia   Final Diagnosis encephalopathy   Discharge Diagnosis 1 bronchietasis flair   2 malnutrition   3 dementia    Chief Complaint/History of Present Illness see h and p     Routine Chem:  09-Apr-14 04:56   Result Comment PLATELETS - FIBRIN STRANDS SEEN ON SMEAR. THIS MAY  - AFFECT THE PLATELET COUNT. THE ACTUAL  - NUMERICAL COUNT MAY BE SOMEWHAT HIGHER  - THAN THE REPORTED VALUE.  - TPL  Result(s) reported on 13 May 2012 at 06:14AM.  Result Comment potassium - Slight hemolysis, interpret results with  - caution.  Result(s) reported on 13 May 2012 at 06:14AM.  Glucose, Serum 91  BUN 7  Creatinine (comp) 0.60  Sodium, Serum 140  Potassium, Serum 3.6  Chloride, Serum 107  CO2, Serum 26  Calcium (Total), Serum  7.7  Anion Gap 7  Osmolality (calc) 277  eGFR (African American) >60  eGFR (Non-African American) >60 (eGFR values <108m/min/1.73 m2 may be an indication of chronic kidney disease (CKD). Calculated eGFR is useful in patients with stable renal function. The eGFR calculation will not be reliable in acutely ill patients when serum creatinine is changing rapidly. It is not useful in  patients on dialysis. The eGFR calculation may not be applicable to patients at the low and high extremes of body sizes, pregnant women, and vegetarians.)  Routine Hem:  09-Apr-14 04:56   WBC (CBC) 3.6  RBC (CBC) 4.20  Hemoglobin (CBC) 12.4  Hematocrit (CBC) 37.1  Platelet Count (CBC)  109  MCV 88  MCH 29.6  MCHC 33.5  RDW 13.8  Neutrophil % 58.7  Lymphocyte % 26.7  Monocyte % 13.7  Eosinophil % 0.4  Basophil % 0.5  Neutrophil # 2.1  Lymphocyte # 1.0  Monocyte # 0.5  Eosinophil # 0.0  Basophil # 0.0   PERTINENT RADIOLOGY STUDIES: XRay:    06-Apr-14 13:46, Chest Portable Single View  Chest Portable Single View   REASON FOR EXAM:     weakness  COMMENTS:       PROCEDURE: DXR - DXR PORTABLE CHEST SINGLE VIEW  - May 10 2012  1:46PM     RESULT: No prior recent studies for comparison. Left lower lobe   infiltrate consistent with pneumonia. Right lung clear. Heart size   normal. Scoliosis.    IMPRESSION:  Left lower lobe infiltrate suggesting pneumonia.        Verified By: TOsa Craver M.D., MD   Hospital Course:  Hospital Course Stable during the hospitalization, off O2. Still confused. Cough is above baseline. Known hx of lll bronchietasis, finishing abx's for this. She is still confused, original thought was placement for her but per notes family and care managers have changed their minds. She is stable otherwise and ready for dc. Remeron will be givne for malnutrition and wt loss. Note it took 34 min for dc tasks   Condition on Discharge Satisfactory   DISCHARGE INSTRUCTIONS HOME MEDS:  Medication Reconciliation: Patient's Home Medications at Discharge:     Medication Instructions  namenda 10 mg oral tablet  1 tab(s) orally 2 times a day   plavix 75 mg oral tablet  1 tab(s) orally once a day   dicyclomine 20 mg oral tablet  1 tab(s) orally 3 times a day, As Needed stomach.   mirtazapine 15 mg oral tablet  1 tab(s) orally once  a day (at bedtime)   amoxicillin-clavulanate  875 milligram(s) orally 2 times a day     Physician's Instructions:  Diet Regular   Activity Limitations As tolerated   Return to Work Not Applicable   Time frame for Follow Up Appointment per orders   Electronic Signatures: Kirk Ruths (MD)  (Signed 09-Apr-14 07:57)  Authored: ADMISSION DATE AND DIAGNOSIS, CHIEF COMPLAINT/HPI, PERTINENT LABS, [place], PATIENT INSTRUCTIONS   Last Updated: 09-Apr-14 07:57 by Kirk Ruths (MD)

## 2014-05-28 NOTE — Discharge Summary (Signed)
PATIENT NAME:  Mindy Gilbert, Mindy Gilbert MR#:  [id number] DATE OF BIRTH:  1925-05-08  DATE OF ADMISSION:  03/11/2013 DATE OF DISCHARGE:  03/15/2013   DISCHARGE DIAGNOSES: 1.  Acute on chronic respiratory failure.  2.  Hypoxia related to the above.  3.  Bronchiectasis with flare.  4.  Chronic obstructive pulmonary disease with flare.  5.  Dementia, chronic.  6.  Encephalopathy secondary to all the above.   DISCHARGE MEDICATIONS: Per [facility] med reconciliation; basically will be on Levaquin and prednisone taper, in addition to her regular medications.   HISTORY AND PHYSICAL: Please see detailed history and physical done on admission.   HOSPITAL COURSE: The patient was admitted short of breath, coughing, hypoxic. She was put on IV steroids, IV antibiotics and nebulized bronchodilators. She improved markedly. She was very confused and weak. This improved as well as her underlying conditions improved. She worked with physical therapy, was ambulating in the halls. Discussion about long-term plan with patient and family at first had the idea of a skilled care, at least temporarily. At this point, she is improved. They are planning on having sitters at home for her and her husband who is confused as well. We will get home health physical therapy and nursing, as well as nursing assistance to help manage  her needs at this point. Further testing showed a creatinine normal at 0.92 with minimally decreased GFR of 57. CBC was normal on February 7th. Blood cultures were negative. UA was minimally abnormal with mild pyuria, but no culture was done. White blood cell count was 11,200 on admission.   Of note, it took approximately 34 minutes to do all discharge tasks today.   ____________________________ Marya AmslerMarshall W. Dareen PianoAnderson, MD mwa:dmm D: 03/15/2013 08:59:00 ET T: 03/15/2013 12:59:22 ET JOB#: [id number]  cc: Marya AmslerMarshall W. Dareen PianoAnderson, MD, <Dictator> Lauro RegulusMARSHALL W Gaston Dase MD ELECTRONICALLY SIGNED 03/16/2013 7:53

## 2014-05-28 NOTE — Consult Note (Signed)
PATIENT NAME:  Mindy Gilbert, Mindy Gilbert MR#:  [id number] DATE OF BIRTH:  1925/07/30  DATE OF CONSULTATION:  06/07/2013  REFERRING PHYSICIAN:  Alford Highlandichard Wieting, MD CONSULTING PHYSICIAN:  Kathreen DevoidKevin L. Alver Leete, MD  REASON FOR CONSULTATION:  Right intratrochanteric hip fracture.   HISTORY OF PRESENT ILLNESS:  Ms. Mindy Gilbert is an 81 year old female who sustained a fall at [facility] facility where she resides. The patient has dementia. She is reported to have fallen out of a chair. She was brought to the [facility] Department after her fall and diagnosed with a right intertrochanteric hip fracture by x-ray. Upon my evaluation this evening, the patient was in her hospital room. Her daughter and son-in-law are at the bedside. The patient has significant dementia and is unable to provide an accurate history. The history is from her daughter.   I have reviewed the patient's past medical history, past surgical history, family and social history, as well as medication and allergies from the EMR and admission Gilbert and P.   PHYSICAL EXAMINATION:  EXTREMITIES: Right hip: The patient's skin is intact. There is no erythema, ecchymosis or swelling. Her thigh compartments are soft and compressible. The patient has slight external rotation and shortening of the right lower extremity. Her right lower extremity is Buck's traction. She has palpable pedal pulses. She is spontaneously flexing and extending her toes and ankle. Her sensory function was difficult to assess based on the patient being a poor historian.   RADIOLOGY: X-ray films of the right hip and pelvis were reviewed. This shows an intertrochanteric hip fracture with varus angulation at the fracture site. The lesser trochanter is intact. There is displacement of the fracture.   ASSESSMENT:  Right intertrochanteric hip fracture.   PLAN:  I am recommending that Mindy Gilbert undergo surgical fixation for right hip. I am recommending intramedullary rod  placement. I discussed the injury as well as the proposed surgery to the patient's daughter, who is the power of attorney. I used the patient's white board in her room to diagram the injury and the proposed procedure. I discussed the risks and benefits of surgery. The patient's daughter understands the risks include, but are not limited to infection, bleeding requiring blood transfusion, nerve or blood vessel injury, malunion, nonunion, change in lower extremity rotation and lower extremity shortening. They also understand hardware failure, persistent right hip pain and failure to her return to ambulation are also risks. Medical risks include, but are not limited to DVT and pulmonary embolism, myocardial infarction, stroke, pneumonia, respiratory failure and death. I signed the patient's right leg with the word "yes" according to the hospital's right side protocol. I reviewed her laboratories and radiographic studies in preparation for surgery. The patient has been cleared by medicine for surgery. She is scheduled for surgery tomorrow morning on 06/08/2013, at approximately 10:30. I answered all questions by the patient's daughter. The goal for overnight will be to maintain the patient's comfort.    ____________________________ Kathreen DevoidKevin L. Julio Storr, MD klk:dmm D: 06/07/2013 18:24:48 ET T: 06/07/2013 19:22:00 ET JOB#: [id number]  cc: Kathreen DevoidKevin L. Kerrilyn Azbill, MD, <Dictator> Kathreen DevoidKEVIN L Kristofer Schaffert MD ELECTRONICALLY SIGNED 06/11/2013 21:12

## 2014-05-28 NOTE — H&P (Signed)
PATIENT NAME:  Mindy Gilbert, Mindy Gilbert MR#:  [id number] DATE OF BIRTH:  1925/08/03  DATE OF ADMISSION:  06/07/2013  PRIMARY CARE PHYSICIAN:  Dr. Alphonsus Sias now that she is at [facility].   CHIEF COMPLAINT: Brought in with hip pain after a fall.   HISTORY OF PRESENT ILLNESS: This is an 81 year old female with dementia, unable to give me any history. Apparently, she got out of her chair and fell. She does have a history of dementia. She only walks with assistance and not very much. She has had a drastic decline since February and has not been the same since. She was found to have a right hip fracture. She is in terrible pain with the hip fracture. Dilaudid was the only thing that helped in the ER. Hospitalist services were contacted for further evaluation. The patient has no complaints of chest pain, some shortness of breath.   PAST MEDICAL HISTORY: Dementia, chronic respiratory failure with interstitial lung disease and Mycobacterium avium complex, history of hypertension and gastroesophageal reflux disease.   PAST SURGICAL HISTORY: None.   ALLERGIES: No known drug allergies.   MEDICATIONS: List that the facility sent over with the patient includes Prilosec 20 mg twice a day, Ensure clear t.i.d., Xanax 0.25 mg 3 times a day as needed for anxiety, Remeron 15 mg at bedtime half tablet, Magic Cup at lunch and dinner, Celexa 30 mg daily.   SOCIAL HISTORY: Currently at [facility]. No smoking. No alcohol. No drug use. Used to work in the [employer].  FAMILY HISTORY: Father died at 65 of heart failure, also had lung disease and TB. Mother died at 65, complications after a hip fracture. Sister that died of MAC and MS.  REVIEW OF SYSTEMS:  Difficult to obtain secondary to dementia. No fever   EYES: She does wear glasses. EARS, NOSE, MOUTH AND THROAT: No sore throat or difficulty swallowing.  CARDIOVASCULAR: No chest pain.  RESPIRATORY: Positive for shortness of breath.   GASTROINTESTINAL: No nausea. No vomiting. No abdominal pain.  GENITOURINARY: No burning on urination.  MUSCULOSKELETAL: Positive for hip pain.  INTEGUMENT: No rashes or eruptions.  NEUROLOGIC: No fainting or blackouts.  PSYCHIATRIC: On medication for depression.  ENDOCRINE: No thyroid problems. HEMATOLOGIC AND LYMPHATIC: No anemia.   PHYSICAL EXAMINATION: VITAL SIGNS: Temperature 97.8, pulse 84, respirations 20, blood pressure 114/66, pulse oximetry 96% on oxygen.  GENERAL: No respiratory distress, lying flat in bed.  EYES: Conjunctivae and lids normal. Pupils equal, round and reactive to light. Extraocular muscles intact. No nystagmus. EARS, NOSE, MOUTH AND THROAT: Tympanic membranes: No erythema. Nasal mucosa: No erythema. Throat: No erythema. No exudate seen. Lips and gums: No lesions.  NECK: No JVD. No bruits. No lymphadenopathy. No thyromegaly. No thyroid nodules palpated.  RESPIRATORY: Lungs clear to auscultation. No use of accessory muscles to breathe. No rhonchi, rales or wheeze heard.  CARDIOVASCULAR: S1 and S2, irregular. No gallops, rubs or murmurs heard. Carotid upstroke 2+ bilaterally. Dorsalis pedis pulses 1+ bilaterally. Trace edema of the lower extremity.  ABDOMEN: Soft, nontender. No organomegaly/splenomegaly. Normoactive bowel sounds. No masses felt.  LYMPHATIC: No lymph nodes in the neck.  MUSCULOSKELETAL: Right leg shortened and externally rotated. Trace edema. No clubbing. No cyanosis.  SKIN: No ulcers or lesions.  NEUROLOGIC: Cranial nerves II through XII grossly intact. Did not test deep tendon reflexes with hip fracture.  PSYCHIATRIC: The patient is alert, oriented to person.   LABORATORY AND RADIOLOGICAL DATA: Urinalysis negative.   Chest x-ray:  No acute disease. Right hip: Acute right hip fracture.   White blood cell count 8.3, H and H 11.9 and 37.1, platelet count of 262. Glucose 99, BUN 14, creatinine 0.63. Sodium 138, potassium 3.6, chloride 104, CO2 of 26,  calcium 8.8. Liver function tests normal range. PT, INR normal range.   EKG: Sinus arrhythmia with right bundle branch block and left anterior fascicular block, nonspecific ST and T wave changes.   ASSESSMENT AND PLAN: 1.  Right hip fracture. No contraindications to surgery at this time. Family is willing to undergo risks of surgery to help out with fracture pain, even though the patient may not walk after the procedure. The patient is a moderate risk for surgery with her age and medical issues. The patient is a FULL CODE. The patient's family understands that the prognosis after a hip fracture is poor, and there is a high mortality rate within the first year of a hip fracture, especially if she does not get up and walk again. Can proceed with surgery.  2.  Chronic respiratory failure with interstitial lung disease and MAC. Oxygen supplementation as needed.  3.  Atrial fibrillation history. Currently, looks like sinus arrhythmia, rate controlled at this point.  4.  Gastroesophageal reflux disease. We will use Protonix while here.  5.  Advanced dementia.  Overall prognosis is poor. The patient is a FULL CODE.  TIME SPENT ON ADMISSION:  50 minutes.    ____________________________ Herschell Dimesichard J. Renae GlossWieting, MD rjw:ce D: 06/07/2013 17:10:37 ET T: 06/07/2013 18:21:02 ET JOB#: [id number]  cc: Herschell Dimesichard J. Renae GlossWieting, MD, <Dictator> Karie Schwalbeichard I. Letvak, MD Salley ScarletICHARD J Katieann Hungate MD ELECTRONICALLY SIGNED 06/13/2013 14:51

## 2014-05-28 NOTE — H&P (Signed)
PATIENT NAME:  Mindy BarrowsSIGMON, Mindy Gilbert MR#:  [id number] DATE OF BIRTH:  1925/09/21  DATE OF ADMISSION:  03/11/2013  PRIMARY CARE PHYSICIAN:  Dr. Einar CrowMarshall Anderson.   CHIEF COMPLAINT:  Shortness of breath and cough and generalized weakness.   HISTORY OF PRESENT ILLNESS:  This is an 81 year old female who presents to the hospital due to cough, shortness of breath and generalized weakness. The patient has underlying dementia, lives with her husband. The husband has underlying bronchitis presently. She also has developed the upper respiratory symptoms of cough and shortness of breath. This morning, when they were attempting to get her off the toilet, she was feeling very weak. She had significant rattling in her upper airway and was having shortness of breath and therefore was sent over to the ER for further evaluation. In the Emergency Room, the patient was noted to be hypoxic and tachypneic. She was also noted to have some wheezing. A diagnosis of COPD exacerbation secondary to acute bronchitis was made and hospitalist services were contacted for further treatment and evaluation.   REVIEW OF SYSTEMS: CONSTITUTIONAL:  No documented fever. Positive generalized weakness. No weight gain, no weight loss.  EYES:  No blurred or double vision.  ENT:  No tinnitus. No postnasal drip. No redness of the oropharynx.  RESPIRATORY:  Positive cough. No wheeze. No hemoptysis. Positive dyspnea.  CARDIOVASCULAR:  No chest pain. No orthopnea, no palpitations, no syncope.  GASTROINTESTINAL:  No nausea. No vomiting. No diarrhea. No abdominal pain. No melena or hematochezia.  GENITOURINARY:  No dysuria or hematuria.  ENDOCRINE:  No polyuria or nocturia. No heat or cold intolerance.  HEMATOLOGIC:  No anemia, no bruising, no bleeding.  INTEGUMENTARY:  No rashes. No lesions.  MUSCULOSKELETAL:  No arthritis. No swelling. No gout.  NEUROLOGIC:  No numbness or tingling. No ataxia. No seizure activity.  PSYCHIATRIC:  No anxiety, no  insomnia. No ADD. Positive depression.   PAST MEDICAL HISTORY:  Consistent with chronic MAI infection, interstitial lung disease, dementia and depression.   ALLERGIES:  No known drug allergies.   SOCIAL HISTORY:  No smoking. No alcohol abuse. No illicit drug abuse. Lives at home with her husband.   FAMILY HISTORY:  Both mother and father are deceased. Mother died from complications of old age. Father died from congestive heart failure.   CURRENT MEDICATIONS:   1.  Celexa 10 mg daily.  2.  Namenda 10 mg b.i.d.  3.  Zofran 4 mg q.8 hours as needed.  4.  Plavix 75 mg daily.  5.  Protonix 40 mg daily.   PHYSICAL EXAMINATION:  VITAL SIGNS:  Temperature 98.6, pulse 74, respirations 19, blood pressure 114/69, sats 89% on room air.  GENERAL:  A pleasant-appearing female in mild respiratory distress.  HEAD, EYES, EARS, NOSE, THROAT:  Atraumatic, normocephalic. Extraocular muscles are intact. Pupils equal and reactive to light. Sclerae anicteric. No conjunctival injection. No pharyngeal erythema.  NECK:  Supple. No jugular venous distention. No bruits, no lymphadenopathy, no thyromegaly.  HEART:  Regular rate and rhythm. No murmurs. No rubs. No clicks.  LUNGS:  She has coarse rhonchi and wheezing in expiratory. Negative use of accessory muscles. No dullness to percussion.  ABDOMEN:  Soft, flat, nontender, nondistended. Has good bowel sounds. No hepatosplenomegaly appreciated.  ETREMITIES:  No evidence of any cyanosis, clubbing, or peripheral edema. Has +2 pedal and radial pulses bilaterally.  NEUROLOGICAL:  Alert, awake and oriented x 2. No focal motor or sensory deficits appreciated bilaterally.  SKIN:  Moist and  warm with no rashes appreciated.  LYMPHATIC:  No cervical or axillary lymphadenopathy.   LABORATORY DATA:  Serum glucose of 106, BUN 14, creatinine 0.7, sodium 135, potassium 3.9, chloride 101, bicarb 27. The patient's LFTs are within normal limits. Troponin 0.02. White cell count 11.2,  hemoglobin 13.4, hematocrit 41.7, platelet count of 182. Urinalysis shows 1+ leukocyte esterase with 25 white cells and 3+ bacteria.   The patient did have a chest x-ray done which showed no acute cardiopulmonary disease, advanced chronic interstitial lung disease and scoliosis.   ASSESSMENT AND PLAN:  This is an 81 year old female with a history of chronic MAI infection, interstitial lung disease, dementia, depression, who presents to the hospital due to cough, shortness of breath and generalized weakness.   1.  Chronic obstructive pulmonary disease/interstitial lung disease exacerbation. This is likely secondary to acute bronchitis. The patient likely has gotten exposed from her husband, who is suffering from the similar symptoms. For now, I will treat her with IV steroids, around-the-clock nebulizer treatments, give her empiric Levaquin for underlying bronchitis and follow her clinically. She will need to be assessed for home oxygen prior to discharge.  2.  Generalized weakness. This is likely secondary to her underlying interstitial lung disease. I will get a Physical Therapy evaluation to assess her mobility. She may benefit from Home Health services.  3.  Dementia. Continue Namenda.  4.  Depression. Continue Celexa.   CODE STATUS:  The patient is a FULL CODE.   TIME SPENT:  50 minutes.   ____________________________ Rolly Pancake. Cherlynn Kaiser, MD vjs:jm D: 03/11/2013 14:11:30 ET T: 03/11/2013 14:32:46 ET JOB#: [id number]  cc: Rolly Pancake. Cherlynn Kaiser, MD, <Dictator> Houston Siren MD ELECTRONICALLY SIGNED 03/20/2013 18:00

## 2014-05-28 NOTE — Op Note (Signed)
PATIENT NAME:  Mindy BarrowsSIGMON, Carreen H MR#:  [id number] DATE OF BIRTH:  1926/01/31  DATE OF PROCEDURE:  06/08/2013  PREOPERATIVE DIAGNOSIS: Right intertrochanteric hip fracture.   POSTOPERATIVE DIAGNOSIS: Right intertrochanteric hip fracture.   PROCEDURE: Intramedullary fixation of right intertrochanteric hip fracture.   SURGEON: Juanell FairlyKevin Lavel Rieman, MD   ANESTHESIA: Spinal.   ESTIMATED BLOOD LOSS: 25 mL.   COMPLICATIONS: None.   IMPLANTS: Biomet short Affixus nail with a 95 mm lag screw and 34 mm distal interlocking screw.  INDICATION FOR SURGERY: The patient is an 81 year old female who sustained an intertrochanteric hip fracture of the right hip from a fall. The patient is ambulatory at baseline. I have recommended that the patient undergo intramedullary fixation for her hip fracture for pain control as well as in an effort to hopefully return her to the ability to stand and ambulate. I reviewed the risks and benefits of surgery with the patient's daughter prior to surgery. The patient has dementia, and her daughter is the power of attorney. She understood the risks included infection, bleeding requiring blood transfusion, nerve or blood vessel injury, persistent hip pain, change in lower extremity rotation, leg length discrepancy, persistent pain or weakness, the inability to return to walking, hardware failure, screw cutout, and the need for further surgery. Medical risks include, but are not limited to DVT and pulmonary embolism, myocardial infarction, stroke, pneumonia, respiratory failure, and death. The patient's daughter understood these risks and agreed to proceed with surgery.   PROCEDURE NOTE: The patient was brought to the operating room where she underwent a spinal anesthetic. She was then positioned in a supine position on a fracture table. The right leg was placed in a legholder where traction and internal rotation were applied. The left leg was placed in a hemi-lithotomy position. All bony  prominences were adequately padded.   A timeout performed to verify the patient's name, date of birth, medical record number, correct site of surgery, and correct procedure to be performed. It was also used to verify the patient had received antibiotics and that all appropriate instruments, implants, and radiographic studies were available in the room. Once all in attendance were in agreement, the case began.   C-arm images in both the AP and lateral planes were used to confirm the closed reduction of the fracture. Once the fracture was adequately reduced, an incision was made in line with the lateral femur just proximal to the tip of the greater trochanter. This incision was approximately 3 cm long. A deep blade was then used to incise the deep fascia. The abductor medius was then divided in line with its fibers bluntly to allow for palpation of the tip of the greater trochanter. A threaded guide pin was then advanced through the tip of the greater trochanter into the proximal femur to the level just below the lesser trochanter. This position of the guide pin again was confirmed on AP and lateral C-arm images. This was then overdrilled with the proximal femoral reamer. This allowed for placement of a short Affixus 13 mm in diameter nail into the proximal femur. Its position also was confirmed on AP and lateral C-arm images. A drill guide was then placed through the guide arm of the Affixus nail. A small incision was made along the lateral thigh to allow for advancement of this drill guide into the lateral cortex of the femur. A small incision had been made in the fascia lata to allow for positioning of this drill guide along the lateral femur. A  threaded guide pin was then advanced across the fracture and into the femoral head. Its position was confirmed within the femoral head on the AP and lateral images. A tip-apex distance of less than 25 mm was achieved. The depth of the guide pin was then measured using a  depth gauge. It was found to be 95 mm in length. The guide pin was then overdrilled with a drill for the lag screw to a depth of 95 mm. Again, this drill was confirmed on AP and lateral C-arm images. The drill was then removed. A 95 mm lag screw was then advanced by hand into position within the femoral head to the level of the subcortical bone. The position of the lag screw was again confirmed on AP and lateral C-arm images. Traction was removed off the right leg, and the set screw was tightened and then turned a quarter turn counterclockwise to allow for compression of the fracture, after the fracture had been compressed using the compression wheel.   The attention was then turned to placement of the distal interlocking screw. A second drill guide was placed through the Affixus guide arm along the lateral femur. Again a small, approximately 1 cm incision was made along the lateral thigh, and a deep #10 blade was used to make a small incision in the fascia lata. This allowed for placement of the drill guide along the lateral femur. A drill for the distal interlocking screws was then placed bicortically. The depth of this screw was measured to be 34 mm in length. A 34 mm distal interlocking screw was then placed by hand into position. The guide arm for the Affixus nail was then removed. Final C-arm images of the construct were taken in both the AP and lateral planes. All wounds were copiously irrigated. The deep fascia of the proximal incision was closed with interrupted 0 Vicryl, and then the subcutaneous tissue was closed with 2-0 Vicryl and the skin approximated with staples. A dry sterile dressing was applied. The patient was then transferred to a hospital bed and brought to the PACU in stable condition. I was scrubbed and present for the entire case, and all sharp and instrument counts were correct at the conclusion of the case. I spoke with the patient's daughter postoperatively to let her know the case had  gone without complication and the patient was stable in the recovery room.    ____________________________ Kathreen Devoid, MD klk:jcm D: 06/12/2013 15:36:49 ET T: 06/12/2013 16:21:20 ET JOB#: [id number]  cc: Kathreen Devoid, MD, <Dictator> Kathreen Devoid MD ELECTRONICALLY SIGNED 06/12/2013 18:30

## 2014-05-28 NOTE — Discharge Summary (Signed)
PATIENT NAME:  Mindy Gilbert, FILICE MR#:  [id number] DATE OF BIRTH:  10-May-1925  DATE OF ADMISSION:  06/07/2013 DATE OF DISCHARGE:  06/15/2013  CONSULTANTS: Dr. Martha Clan from orthopedics, Dr. Belia Heman from pulmonary, physical therapy.   CHIEF COMPLAINT: Brought in for hip pain after a fall.   PRIMARY CARE PHYSICIAN: Dr. Alphonsus Sias at [facility].   DISCHARGE DIAGNOSES: 1.  Right hip fracture due to mechanical fall status post hip surgery.  2.  Sepsis with bilateral pneumonia with acute on chronic hypoxic respiratory failure in the setting of chronic interstitial lung disease and Mycobacterium avium complex. 3.  Likely aspiration pneumonia.  4.  Chronic atrial fibrillation.  5.  Shock, currently resolved, likely due to pneumonia and acute blood loss anemia requiring transfusion.  6.  Gastroesophageal reflux disease.  7.  Advanced dementia.  8.  Hypokalemia.  9.  History of hypertension.   DISCHARGE MEDICATIONS:  1.  Celexa 10 mg daily. 2.  Prilosec 20 mg 2 times a day. 3.  Remeron disintegrating tablets 50 mg, take 1/2 tab at bedtime. 4.  Miconazole/zinc oxide topical ointment applied to effected areas. 5.  Xanax 0.25 mg 3 times a day. 6.  Acetaminophen 650 mg every 4 hours as needed. 7.  Nystatin topical cream apply every 12 hours. 8.  Calcium with vitamin D 2 times a day. 9.  Vitamin A plus D topical applied 2 times a day. 10.  Augmentin 875/125 mg every 12 hours for 3 days. 11.  Cardizem CD 120 mg once a day. 12.  Acetaminophen/hydrocodone 325/5 mg 1 tab every 4 to 6 hours as needed for pain. 13.  Lovenox 40 mg subcutaneous once a day for 21 days. 14.  Iron sulfate 225 mg 1 tab once a day with meals.   HOME OXYGEN: She will be getting discharged with home oxygen at 2 liters per nasal cannula.   DISCHARGE DIET: Low sodium.   DISCHARGE ACTIVITY: As tolerated. Consistency of diet is mechanical soft with thin liquids. Ground and chop meats with gravy, added to moisten. Meds in puree. Crushed  as able. Feeding assistance at all meals. Monitor.   DISCHARGE FOLLOWUP: Please follow with PCP and orthopedics within 1 to 2 weeks.   DISPOSITION: Back to [facility].   SIGNIFICANT LABS AND IMAGING: Initial BUN 14, creatinine 0.63, sodium 138, potassium 3.6. Initial white count 8.3. Initial hemoglobin 11.9. Lowest hemoglobin 7.8. Last hemoglobin 9.   Chest x-ray, one view, on day of admission, showing no acute disease.   Right hip complete x-ray showing acute right intertrochanteric fracture.   HISTORY OF PRESENT ILLNESS AND HOSPITAL COURSE: For full details of H and P, please see the dictation on April 4 by Dr. Renae Gloss, but briefly this is a 81 year old with advanced dementia, chronic respiratory failure who came in after a fall, was found to have a hip fracture, was admitted to the hospitalist service and orthopedics was consulted. In regards to the hip, she underwent hip surgery with intramedullary fixation of the right hip fracture under spinal anesthesia. She did well, however, she had bouts of tachycardia and also hypotension and was started on pressors briefly. The patient did develop worsening shortness of breath and shock, which we suspect was secondary to pneumonia, likely aspiration. Currently she has been transferred out of the ICU, is not on pressors.   She did have sepsis per criteria, due to the pneumonia, and will be discharged on 3 additional days of Augmentin. She is still on oxygen and she  is on a dysphagia diet as we suspect the pneumonia might have been aspiration pneumonia.   In regards to the chronic A-fib, she will be discharged on diltiazem extended release. Her shock has resolved. She did have acute blood loss anemia requiring a unit of blood for hypotension and symptomatic anemia. At this time, the hemoglobin is up-trending. The patient is DNR.   TOTAL TIME SPENT: 40 minutes.   FOLLOWUP: Please up with Dr. Alphonsus SiasLetvak Dr. Martha ClanKrasinski   ____________________________ Krystal EatonShayiq  Kerington Hildebrant, MD sa:sb D: 06/15/2013 15:36:18 ET T: 06/15/2013 15:47:45 ET JOB#: [id number]  cc: Krystal EatonShayiq Ayana Imhof, MD, <Dictator> Karie Schwalbeichard I. Letvak, MD Kathreen DevoidKevin L. Krasinski, MD Krystal EatonSHAYIQ Shian Goodnow MD ELECTRONICALLY SIGNED 06/29/2013 18:33

## 2014-05-28 NOTE — Discharge Summary (Signed)
ADDNEDUM  PATIENT NAME:  Mindy Gilbert, Mindy Gilbert MR#:  [id number] DATE OF BIRTH:  [birth year]  DATE OF ADMISSION:  03/11/2013 DATE OF DISCHARGE:  03/17/2013  The patient had several episodes of severe diarrhea and near syncope. She continued to be weak following that and not ambulating well. In addition to this, her husband has developed pneumonia at home and is unable to care for her as he previously did. Therefore, we are having her discharge to skilled nursing facility for further rehab.   DISCHARGE MEDICATIONS: [facility] med reconciliation list are still appropriate. She will need PT/OT consult as well and is on a regular diet.   ____________________________ Marya AmslerMarshall W. Dareen PianoAnderson, MD mwa:aw D: 03/17/2013 07:58:37 ET T: 03/17/2013 08:13:07 ET JOB#: [id number]  cc: Marya AmslerMarshall W. Dareen PianoAnderson, MD, <Dictator> Lauro RegulusMARSHALL W Intisar Claudio MD ELECTRONICALLY SIGNED 03/17/2013 14:04

## 2014-05-28 NOTE — Consult Note (Signed)
Brief Consult Note: Diagnosis: Right intertrochanteric hip fracture.   Patient was seen by consultant.   Consult note dictated.   Recommend to proceed with surgery or procedure.   Orders entered.   Comments: Surgery scheduled for approximately 10:30 AM.  Electronic Signatures: Juanell FairlyKrasinski, Tira Lafferty (MD)  (Signed [id number]-May-15 18:13)  Authored: Brief Consult Note   Last Updated: 04-May-15 18:13 by Juanell FairlyKrasinski, Keegan Ducey (MD)

## 2014-05-29 NOTE — Op Note (Signed)
PATIENT NAME:  Mindy BarrowsSIGMON, Lylliana H MR#:  [id number] DATE OF BIRTH:  1926-01-10  DATE OF PROCEDURE:  03/18/2011  PREOPERATIVE DIAGNOSIS:  Cataract, right eye.   POSTOPERATIVE DIAGNOSIS:  Cataract, right eye.  PROCEDURE PERFORMED:  Extracapsular cataract extraction using phacoemulsification with placement of an Alcon SN6CWS 21.0-diopter posterior chamber lens, serial # [id number].  SURGEON:  Maylon PeppersSteven A. Keyundra Fant, MD  ASSISTANT:  None.  ANESTHESIA:  4% lidocaine and 0.75% Marcaine in a 50/50 mixture with 10 units/ mL of Hylenex added, given as a peribulbar.  ANESTHESIOLOGIST:  Dr. Pernell DupreAdams  COMPLICATIONS:  None.  ESTIMATED BLOOD LOSS:  Less than 1 mL.  DESCRIPTION OF PROCEDURE:  The patient was brought to the operating room and given a peribulbar block.  The patient was then prepped and draped in the usual fashion.  The vertical rectus muscles were imbricated using 5-0 silk sutures.  These sutures were then clamped to the sterile drapes as bridle sutures.  A limbal peritomy was performed extending two clock hours and hemostasis was obtained with cautery.  A partial thickness scleral groove was made at the surgical limbus and dissected anteriorly in a lamellar dissection using an Alcon crescent knife.  The anterior chamber was entered superonasally with a Superblade and through the lamellar dissection with a 2.6 mm keratome.  DisCoVisc was used to replace the aqueous and a continuous tear capsulorrhexis was carried out.  Hydrodissection and hydrodelineation were carried out with balanced salt and a 27 gauge canula.  The nucleus was rotated to confirm the effectiveness of the hydrodissection.  Phacoemulsification was carried out using a divide-and-conquer technique.  Total ultrasound time was 1 minute and 46 seconds with an average power of 22.8 percent.  Irrigation/aspiration was used to remove the residual cortex.  DisCoVisc was used to inflate the capsule and the internal incision was enlarged to  3 mm with the crescent knife.  The intraocular lens was folded and inserted into the capsular bag using the AcrySert delivery system.  Irrigation/aspiration was used to remove the residual DisCoVisc.  Miostat was injected into the anterior chamber through the paracentesis track to inflate the anterior chamber and induce miosis.  The wound was checked for leaks and none were found. The conjunctiva was closed with cautery and the bridle sutures were removed.  Two drops of 0.3% Vigamox were placed on the eye.   An eye shield was placed on the eye.  The patient was discharged to the recovery room in good condition.  ____________________________ Maylon PeppersSteven A. Marvon Shillingburg, MD sad:cms D: 03/18/2011 13:00:42 ET T: 03/18/2011 13:33:58 ET JOB#: [id number]  cc: Viviann SpareSteven A. Jakylan Ron, MD, <Dictator>  Erline LevineSTEVEN A Tevita Gomer MD ELECTRONICALLY SIGNED 03/25/2011 13:31

## 2014-06-10 DIAGNOSIS — J9620 Acute and chronic respiratory failure, unspecified whether with hypoxia or hypercapnia: Secondary | ICD-10-CM

## 2014-06-15 DIAGNOSIS — J9620 Acute and chronic respiratory failure, unspecified whether with hypoxia or hypercapnia: Secondary | ICD-10-CM | POA: Diagnosis not present

## 2014-06-15 DIAGNOSIS — F39 Unspecified mood [affective] disorder: Secondary | ICD-10-CM | POA: Diagnosis not present

## 2014-06-15 DIAGNOSIS — G309 Alzheimer's disease, unspecified: Secondary | ICD-10-CM | POA: Diagnosis not present

## 2014-06-15 DIAGNOSIS — K219 Gastro-esophageal reflux disease without esophagitis: Secondary | ICD-10-CM | POA: Diagnosis not present

## 2014-06-21 DIAGNOSIS — L03012 Cellulitis of left finger: Secondary | ICD-10-CM

## 2014-07-05 DIAGNOSIS — J449 Chronic obstructive pulmonary disease, unspecified: Secondary | ICD-10-CM | POA: Diagnosis not present

## 2014-07-05 DIAGNOSIS — J9611 Chronic respiratory failure with hypoxia: Secondary | ICD-10-CM | POA: Diagnosis not present

## 2014-07-05 DIAGNOSIS — G309 Alzheimer's disease, unspecified: Secondary | ICD-10-CM | POA: Diagnosis not present

## 2014-07-05 DIAGNOSIS — F0280 Dementia in other diseases classified elsewhere without behavioral disturbance: Secondary | ICD-10-CM | POA: Diagnosis not present

## 2014-07-07 IMAGING — CR DG CHEST 2V
1 series · 2 of 2 positions shown · non-contrast
Comparison: none

REASON FOR EXAM: crackles on exam
COMMENTS:

PROCEDURE:     DXR - DXR CHEST PA (OR AP) AND LATERAL  - October 06, 2012  [DATE]
RESULT:     Comparison made to prior study of 10/03/2012. Persistent
infiltrate left lung base noted. Bronchiectasis cannot be excluded. Right
lung clear. Cardiomegaly. No CHF. Apical pleural scarring. Scoliosis.

[Series 1: pa · 0.17mm/px · 2 of 2 slices shown]
[im 1/2]
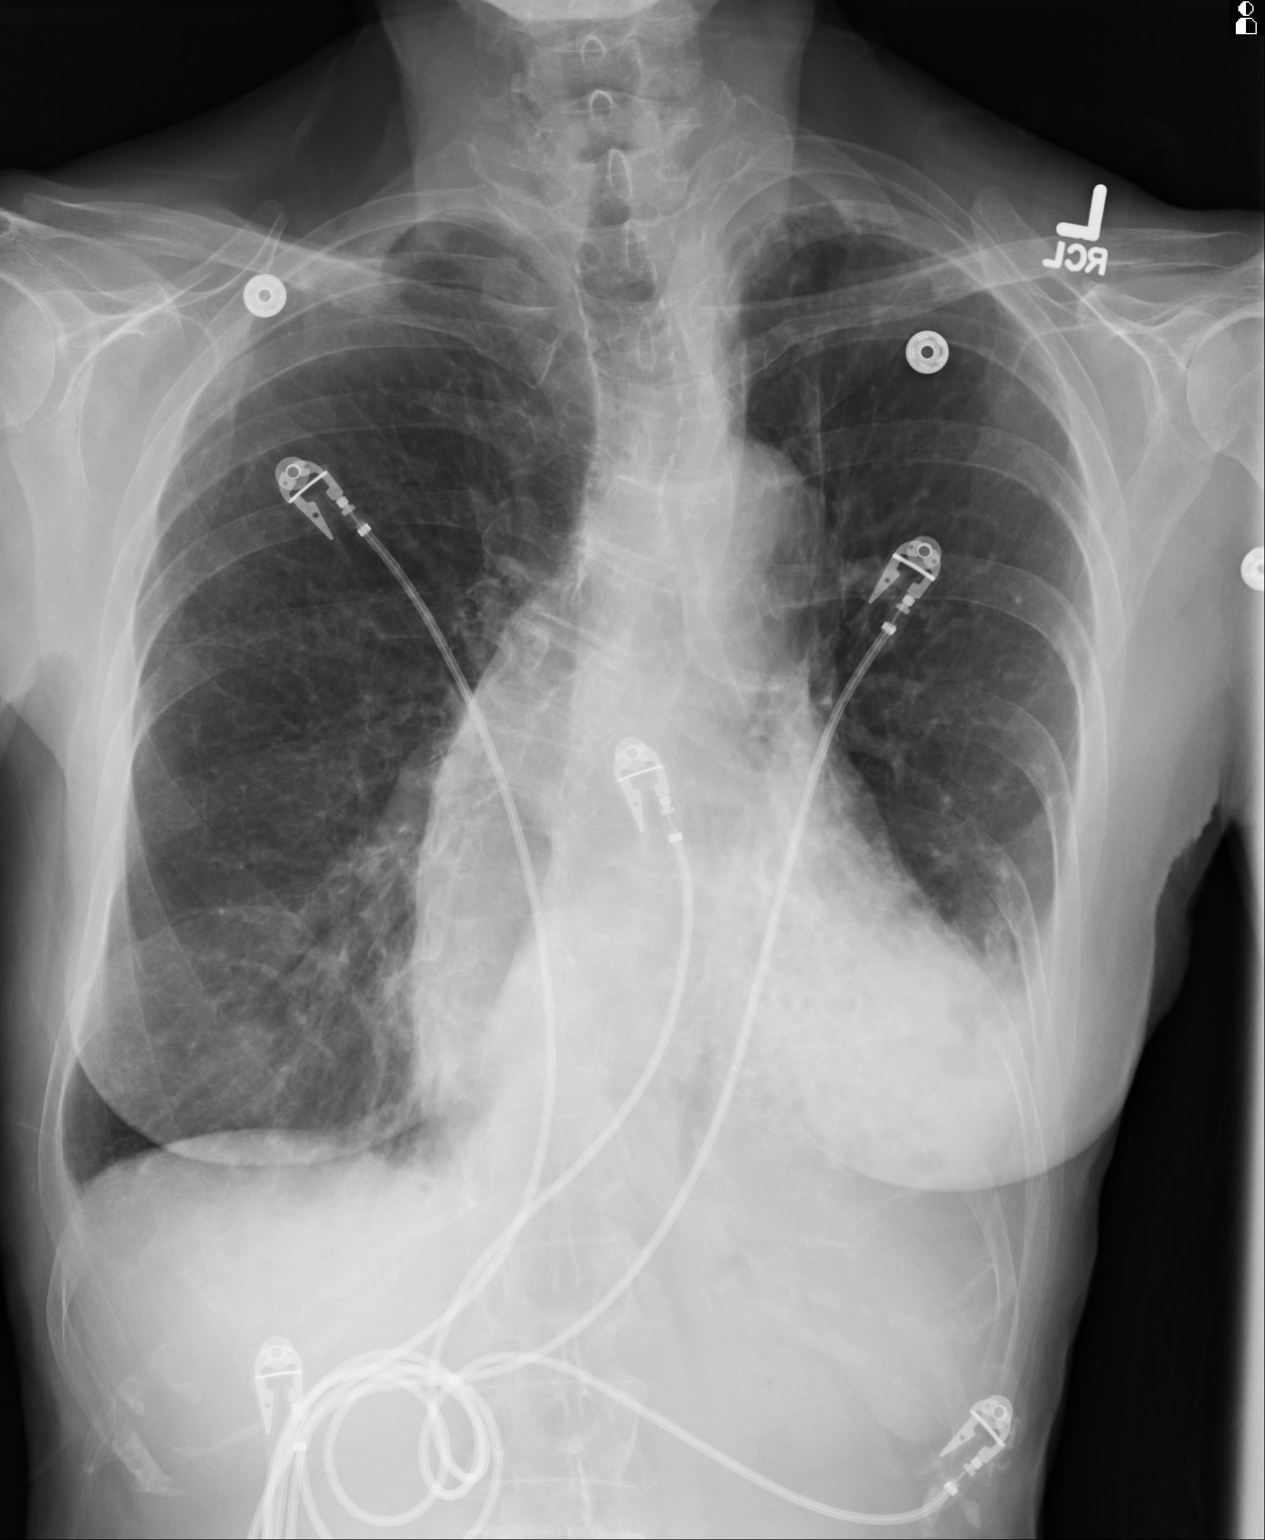
[im 2/2]
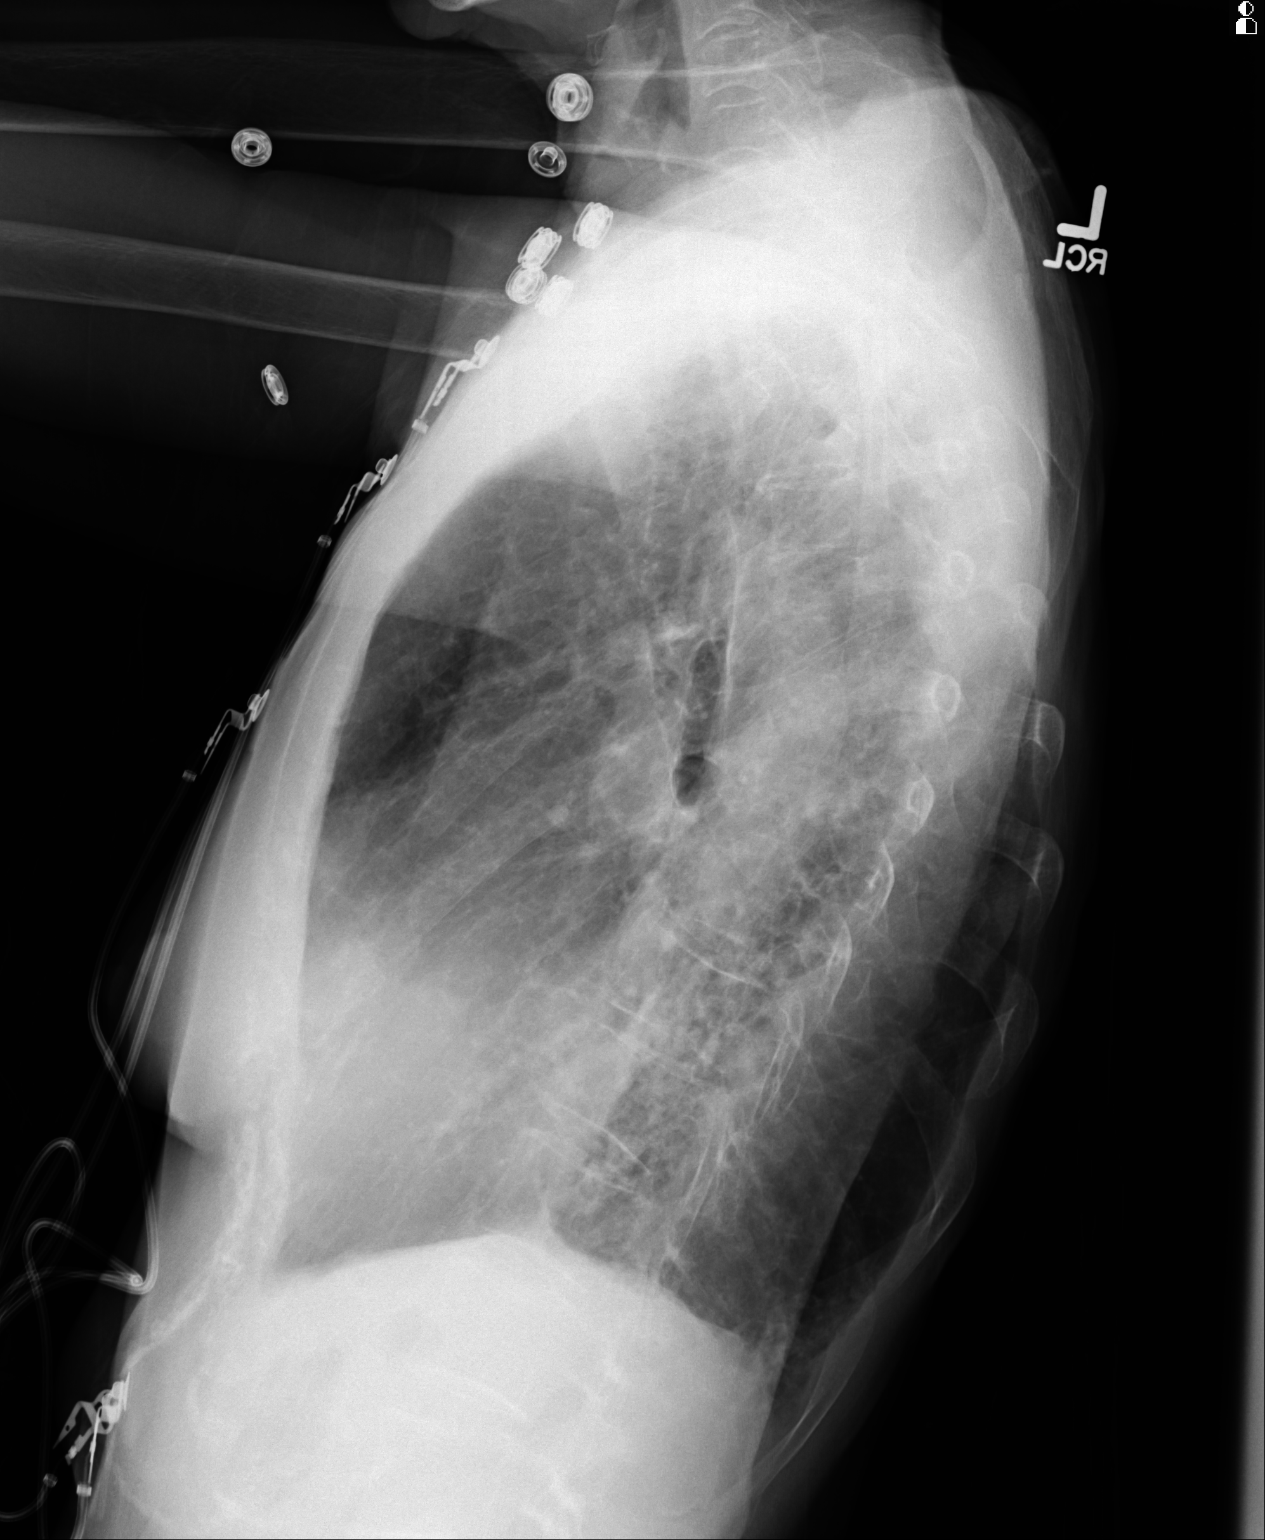

[2 of 2 positions shown; findings below may reference images not displayed]

IMPRESSION: Persistent infiltrate left lower lobe with possible
bronchiectasis.

## 2014-08-16 DIAGNOSIS — I499 Cardiac arrhythmia, unspecified: Secondary | ICD-10-CM | POA: Diagnosis not present

## 2014-08-16 DIAGNOSIS — J9611 Chronic respiratory failure with hypoxia: Secondary | ICD-10-CM | POA: Diagnosis not present

## 2014-08-16 DIAGNOSIS — K219 Gastro-esophageal reflux disease without esophagitis: Secondary | ICD-10-CM

## 2014-08-16 DIAGNOSIS — F39 Unspecified mood [affective] disorder: Secondary | ICD-10-CM | POA: Diagnosis not present

## 2014-09-22 DIAGNOSIS — F0280 Dementia in other diseases classified elsewhere without behavioral disturbance: Secondary | ICD-10-CM | POA: Diagnosis not present

## 2014-09-22 DIAGNOSIS — J449 Chronic obstructive pulmonary disease, unspecified: Secondary | ICD-10-CM | POA: Diagnosis not present

## 2014-09-22 DIAGNOSIS — J9611 Chronic respiratory failure with hypoxia: Secondary | ICD-10-CM | POA: Diagnosis not present

## 2014-09-22 DIAGNOSIS — G309 Alzheimer's disease, unspecified: Secondary | ICD-10-CM | POA: Diagnosis not present

## 2014-10-12 DIAGNOSIS — G309 Alzheimer's disease, unspecified: Secondary | ICD-10-CM | POA: Diagnosis not present

## 2014-10-12 DIAGNOSIS — K219 Gastro-esophageal reflux disease without esophagitis: Secondary | ICD-10-CM

## 2014-10-12 DIAGNOSIS — J9610 Chronic respiratory failure, unspecified whether with hypoxia or hypercapnia: Secondary | ICD-10-CM

## 2014-10-12 DIAGNOSIS — I499 Cardiac arrhythmia, unspecified: Secondary | ICD-10-CM

## 2014-10-12 DIAGNOSIS — F39 Unspecified mood [affective] disorder: Secondary | ICD-10-CM

## 2014-11-24 DIAGNOSIS — L03012 Cellulitis of left finger: Secondary | ICD-10-CM | POA: Diagnosis not present

## 2014-11-30 DIAGNOSIS — R0789 Other chest pain: Secondary | ICD-10-CM

## 2014-12-21 DIAGNOSIS — J9611 Chronic respiratory failure with hypoxia: Secondary | ICD-10-CM | POA: Diagnosis not present

## 2014-12-21 DIAGNOSIS — F0280 Dementia in other diseases classified elsewhere without behavioral disturbance: Secondary | ICD-10-CM | POA: Diagnosis not present

## 2014-12-21 DIAGNOSIS — J449 Chronic obstructive pulmonary disease, unspecified: Secondary | ICD-10-CM | POA: Diagnosis not present

## 2014-12-26 DIAGNOSIS — G301 Alzheimer's disease with late onset: Secondary | ICD-10-CM

## 2014-12-26 DIAGNOSIS — M159 Polyosteoarthritis, unspecified: Secondary | ICD-10-CM

## 2014-12-26 DIAGNOSIS — K219 Gastro-esophageal reflux disease without esophagitis: Secondary | ICD-10-CM

## 2014-12-26 DIAGNOSIS — I499 Cardiac arrhythmia, unspecified: Secondary | ICD-10-CM

## 2014-12-26 DIAGNOSIS — F39 Unspecified mood [affective] disorder: Secondary | ICD-10-CM

## 2015-01-17 DIAGNOSIS — L03032 Cellulitis of left toe: Secondary | ICD-10-CM | POA: Diagnosis not present

## 2015-02-15 DIAGNOSIS — G309 Alzheimer's disease, unspecified: Secondary | ICD-10-CM

## 2015-02-15 DIAGNOSIS — I499 Cardiac arrhythmia, unspecified: Secondary | ICD-10-CM

## 2015-02-15 DIAGNOSIS — J449 Chronic obstructive pulmonary disease, unspecified: Secondary | ICD-10-CM | POA: Diagnosis not present

## 2015-02-15 DIAGNOSIS — J9610 Chronic respiratory failure, unspecified whether with hypoxia or hypercapnia: Secondary | ICD-10-CM

## 2015-02-15 DIAGNOSIS — M1990 Unspecified osteoarthritis, unspecified site: Secondary | ICD-10-CM

## 2015-02-15 DIAGNOSIS — F39 Unspecified mood [affective] disorder: Secondary | ICD-10-CM

## 2015-02-15 DIAGNOSIS — K219 Gastro-esophageal reflux disease without esophagitis: Secondary | ICD-10-CM

## 2015-02-15 DIAGNOSIS — J9611 Chronic respiratory failure with hypoxia: Secondary | ICD-10-CM | POA: Diagnosis not present

## 2015-02-21 DIAGNOSIS — S8012XA Contusion of left lower leg, initial encounter: Secondary | ICD-10-CM | POA: Diagnosis not present

## 2015-04-14 IMAGING — CR RIGHT HAND - COMPLETE 3+ VIEW
1 series · 3 of 3 positions shown · non-contrast
Comparison: None.

CLINICAL DATA: Pain after a fall.

EXAM:
RIGHT HAND - COMPLETE 3+ VIEW

[Series 1: x hand pa right · 0.14mm/px · 3 of 3 slices shown]
[im 1/3]
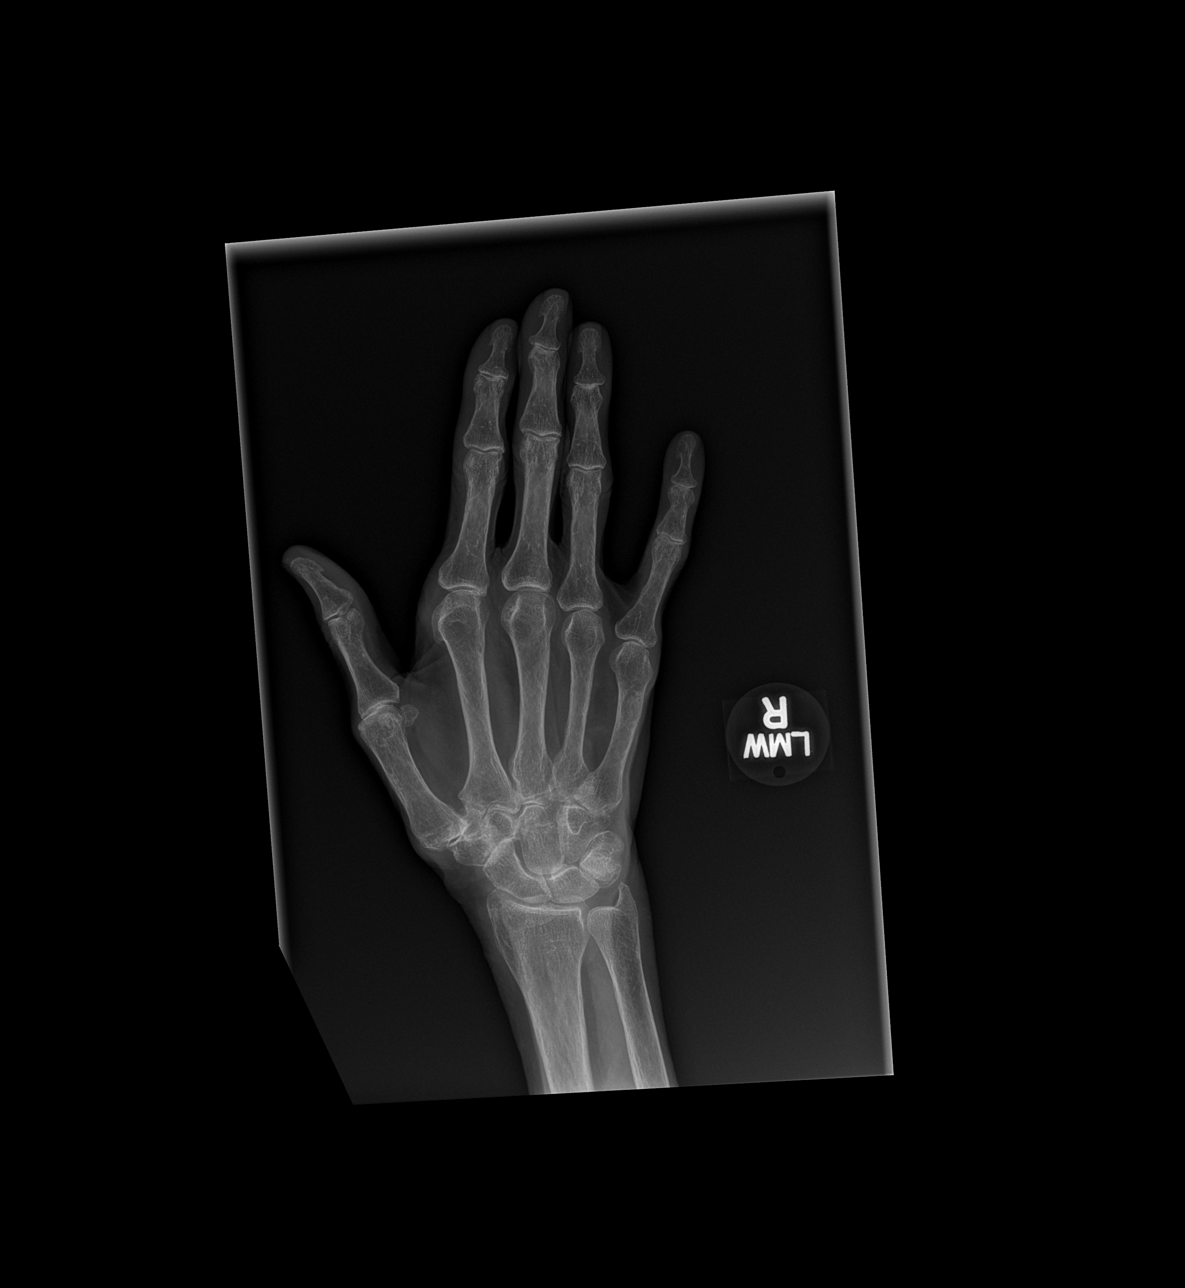
[im 2/3]
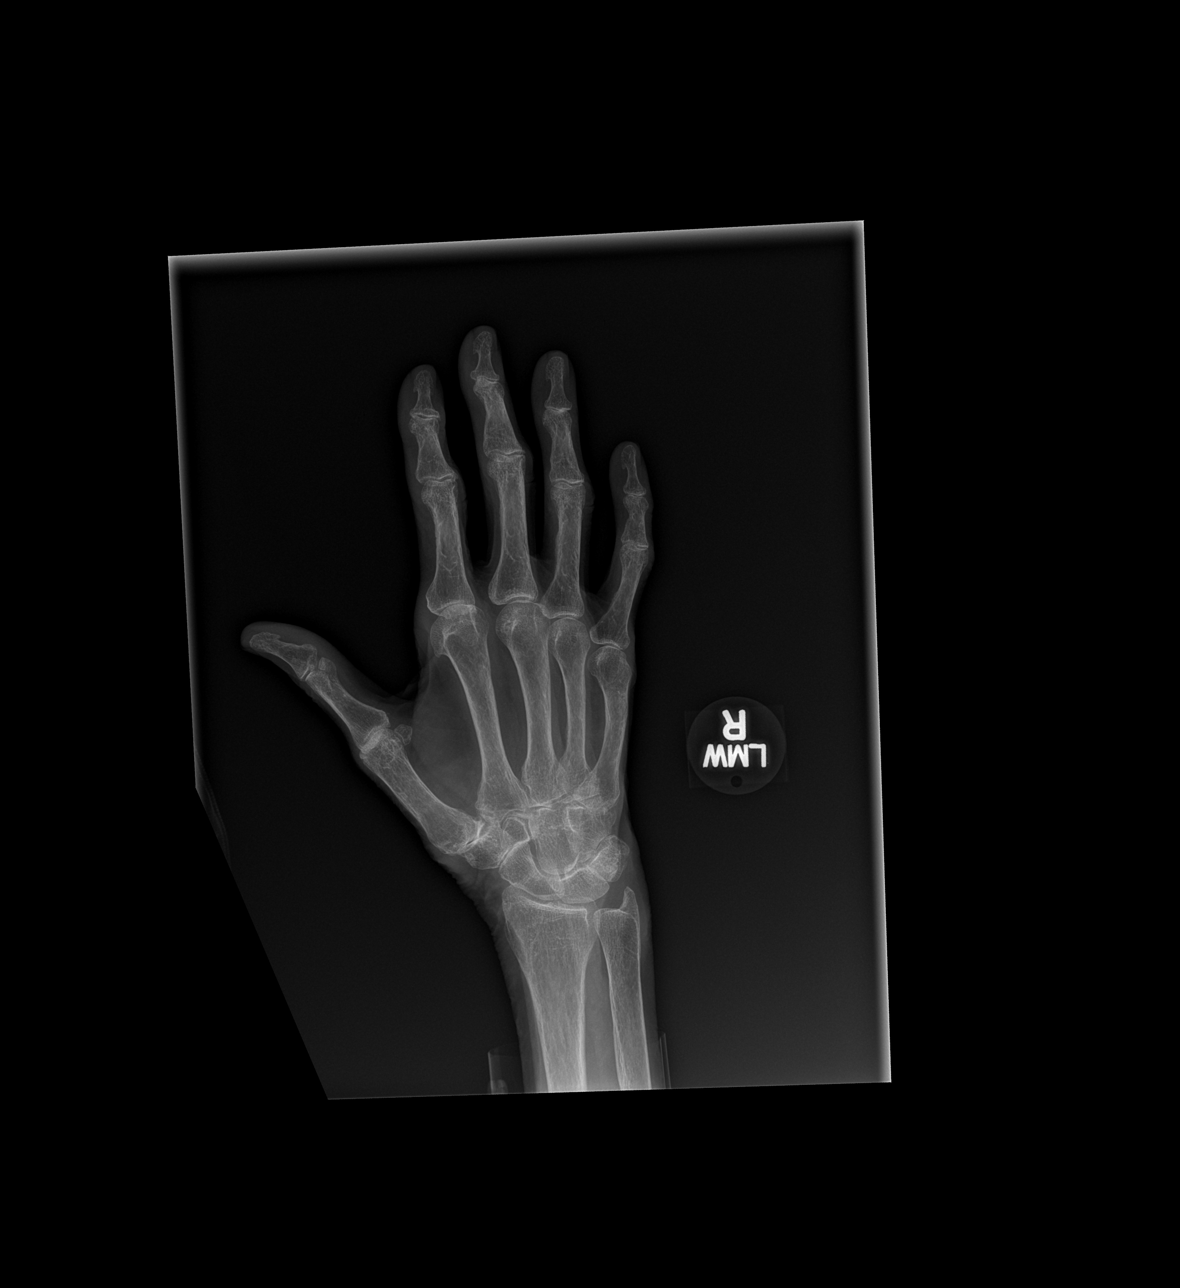
[im 3/3]
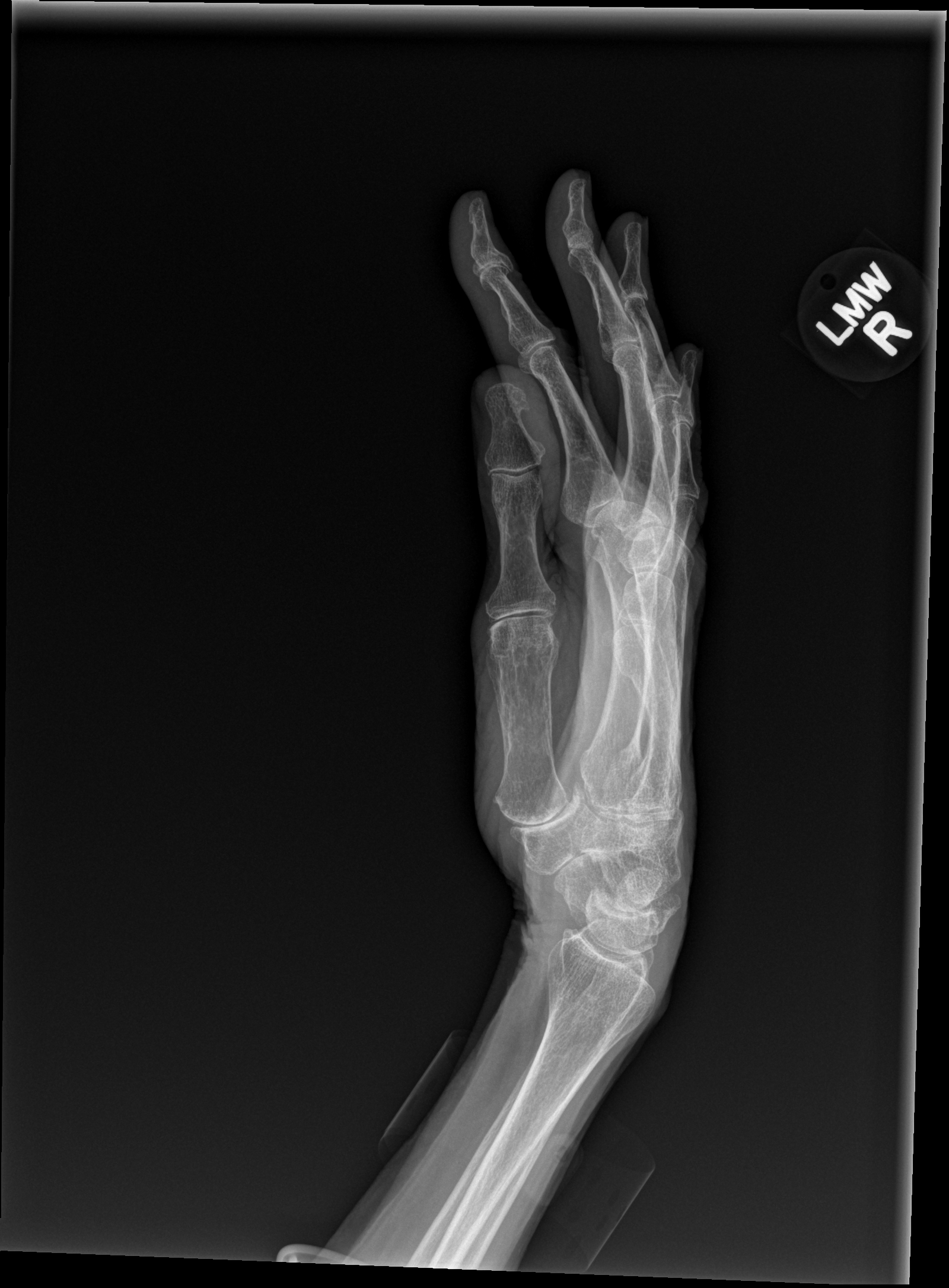

[3 of 3 positions shown; findings below may reference images not displayed]

FINDINGS: Diffuse bone demineralization. Diffuse degenerative changes
throughout the right hand and wrist. No evidence of acute fracture
or dislocation. No focal bone lesion or bone destruction. Soft
tissues are unremarkable.
IMPRESSION: Diffuse demineralization and degenerative changes. No acute bony
abnormalities.

## 2015-04-19 DIAGNOSIS — J869 Pyothorax without fistula: Secondary | ICD-10-CM | POA: Diagnosis not present

## 2015-04-24 DIAGNOSIS — J449 Chronic obstructive pulmonary disease, unspecified: Secondary | ICD-10-CM | POA: Diagnosis not present

## 2015-04-24 DIAGNOSIS — G309 Alzheimer's disease, unspecified: Secondary | ICD-10-CM | POA: Diagnosis not present

## 2015-04-24 DIAGNOSIS — J9611 Chronic respiratory failure with hypoxia: Secondary | ICD-10-CM | POA: Diagnosis not present

## 2015-04-24 DIAGNOSIS — F0280 Dementia in other diseases classified elsewhere without behavioral disturbance: Secondary | ICD-10-CM | POA: Diagnosis not present

## 2015-04-25 DIAGNOSIS — R05 Cough: Secondary | ICD-10-CM | POA: Diagnosis not present

## 2015-04-25 DIAGNOSIS — R0989 Other specified symptoms and signs involving the circulatory and respiratory systems: Secondary | ICD-10-CM | POA: Diagnosis not present

## 2015-05-15 DIAGNOSIS — J209 Acute bronchitis, unspecified: Secondary | ICD-10-CM | POA: Diagnosis not present

## 2015-06-12 DIAGNOSIS — J9611 Chronic respiratory failure with hypoxia: Secondary | ICD-10-CM | POA: Diagnosis not present

## 2015-06-12 DIAGNOSIS — F0280 Dementia in other diseases classified elsewhere without behavioral disturbance: Secondary | ICD-10-CM | POA: Diagnosis not present

## 2015-06-12 DIAGNOSIS — J449 Chronic obstructive pulmonary disease, unspecified: Secondary | ICD-10-CM

## 2015-06-12 DIAGNOSIS — G309 Alzheimer's disease, unspecified: Secondary | ICD-10-CM | POA: Diagnosis not present

## 2015-08-15 DIAGNOSIS — K219 Gastro-esophageal reflux disease without esophagitis: Secondary | ICD-10-CM | POA: Diagnosis not present

## 2015-08-15 DIAGNOSIS — E441 Mild protein-calorie malnutrition: Secondary | ICD-10-CM | POA: Diagnosis not present

## 2015-08-15 DIAGNOSIS — F39 Unspecified mood [affective] disorder: Secondary | ICD-10-CM

## 2015-08-15 DIAGNOSIS — G301 Alzheimer's disease with late onset: Secondary | ICD-10-CM | POA: Diagnosis not present

## 2015-08-15 DIAGNOSIS — I499 Cardiac arrhythmia, unspecified: Secondary | ICD-10-CM | POA: Diagnosis not present

## 2015-08-15 DIAGNOSIS — M159 Polyosteoarthritis, unspecified: Secondary | ICD-10-CM

## 2015-09-04 DIAGNOSIS — F0280 Dementia in other diseases classified elsewhere without behavioral disturbance: Secondary | ICD-10-CM

## 2015-09-04 DIAGNOSIS — J9611 Chronic respiratory failure with hypoxia: Secondary | ICD-10-CM

## 2015-09-04 DIAGNOSIS — J449 Chronic obstructive pulmonary disease, unspecified: Secondary | ICD-10-CM

## 2015-09-04 DIAGNOSIS — G309 Alzheimer's disease, unspecified: Secondary | ICD-10-CM

## 2015-10-10 DIAGNOSIS — R05 Cough: Secondary | ICD-10-CM | POA: Diagnosis not present

## 2015-10-10 DIAGNOSIS — G309 Alzheimer's disease, unspecified: Secondary | ICD-10-CM

## 2015-10-10 DIAGNOSIS — J449 Chronic obstructive pulmonary disease, unspecified: Secondary | ICD-10-CM | POA: Diagnosis not present

## 2015-10-10 DIAGNOSIS — J9611 Chronic respiratory failure with hypoxia: Secondary | ICD-10-CM | POA: Diagnosis not present

## 2015-10-20 DIAGNOSIS — K219 Gastro-esophageal reflux disease without esophagitis: Secondary | ICD-10-CM | POA: Diagnosis not present

## 2015-10-20 DIAGNOSIS — I4901 Ventricular fibrillation: Secondary | ICD-10-CM

## 2015-10-20 DIAGNOSIS — J9611 Chronic respiratory failure with hypoxia: Secondary | ICD-10-CM | POA: Diagnosis not present

## 2015-10-20 DIAGNOSIS — F39 Unspecified mood [affective] disorder: Secondary | ICD-10-CM

## 2015-10-20 DIAGNOSIS — G309 Alzheimer's disease, unspecified: Secondary | ICD-10-CM | POA: Diagnosis not present

## 2015-10-20 DIAGNOSIS — M1990 Unspecified osteoarthritis, unspecified site: Secondary | ICD-10-CM | POA: Diagnosis not present

## 2015-11-07 DIAGNOSIS — J181 Lobar pneumonia, unspecified organism: Secondary | ICD-10-CM | POA: Diagnosis not present

## 2015-12-07 DIAGNOSIS — G309 Alzheimer's disease, unspecified: Secondary | ICD-10-CM | POA: Diagnosis not present

## 2015-12-07 DIAGNOSIS — F0281 Dementia in other diseases classified elsewhere with behavioral disturbance: Secondary | ICD-10-CM | POA: Diagnosis not present

## 2015-12-07 DIAGNOSIS — J449 Chronic obstructive pulmonary disease, unspecified: Secondary | ICD-10-CM | POA: Diagnosis not present

## 2015-12-07 DIAGNOSIS — J9611 Chronic respiratory failure with hypoxia: Secondary | ICD-10-CM | POA: Diagnosis not present

## 2015-12-29 DIAGNOSIS — F39 Unspecified mood [affective] disorder: Secondary | ICD-10-CM

## 2015-12-29 DIAGNOSIS — G301 Alzheimer's disease with late onset: Secondary | ICD-10-CM | POA: Diagnosis not present

## 2015-12-29 DIAGNOSIS — E440 Moderate protein-calorie malnutrition: Secondary | ICD-10-CM | POA: Diagnosis not present

## 2015-12-29 DIAGNOSIS — K219 Gastro-esophageal reflux disease without esophagitis: Secondary | ICD-10-CM | POA: Diagnosis not present

## 2015-12-29 DIAGNOSIS — I499 Cardiac arrhythmia, unspecified: Secondary | ICD-10-CM | POA: Diagnosis not present

## 2016-01-04 DIAGNOSIS — K047 Periapical abscess without sinus: Secondary | ICD-10-CM | POA: Diagnosis not present

## 2016-02-05 DEATH — deceased
